# Patient Record
Sex: Female | Born: 1995 | Hispanic: No | Marital: Single | Smoking: Never smoker
Health system: Southern US, Community
[De-identification: ages and names within clinical notes are randomized; demographics above are authoritative.]

## PROBLEM LIST (undated history)

## (undated) DIAGNOSIS — Z789 Other specified health status: Secondary | ICD-10-CM

## (undated) DIAGNOSIS — Z3400 Encounter for supervision of normal first pregnancy, unspecified trimester: Secondary | ICD-10-CM

## (undated) HISTORY — PX: NO PAST SURGERIES: SHX2092

---
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
## 2018-06-29 ENCOUNTER — Ambulatory Visit (INDEPENDENT_AMBULATORY_CARE_PROVIDER_SITE_OTHER): Payer: Medicaid Other

## 2018-06-29 DIAGNOSIS — Z3201 Encounter for pregnancy test, result positive: Secondary | ICD-10-CM

## 2018-06-29 DIAGNOSIS — Z3200 Encounter for pregnancy test, result unknown: Secondary | ICD-10-CM

## 2018-06-29 LAB — POCT URINE PREGNANCY: PREG TEST UR: POSITIVE — AB

## 2018-06-29 NOTE — Progress Notes (Signed)
I have reviewed the chart and agree with nursing staff's documentation of this patient's encounter.  Catalina AntiguaPeggy Marisha Renier, MD 06/29/2018 11:28 AM

## 2018-06-29 NOTE — Progress Notes (Signed)
..   Ms. Pultz presents today for UPT. She has no unusual complaints. LMP: 05-16-18    OBJECTIVE: Appears well, in no apparent distress.  OB History   None    Home UPT Result: Positive     In-Office UPT result: Positive I have reviewed the patient's medical, obstetrical, social, and family histories, and medications.   ASSESSMENT: Positive pregnancy test  PLAN Prenatal care to be completed at: [facility]

## 2018-08-18 NOTE — L&D Delivery Note (Signed)
Delivery Note At 5:00 PM a viable and healthy female was delivered via Vaginal, Spontaneous (Presentation: LOA).  APGAR: 8, 9; weight  2353 g, 5 lb 3 oz.   Placenta status: spontaneous, intact.  Cord: 3 vessel with the following complications: none.    Anesthesia:  epidural Episiotomy: None Lacerations: 2nd degree;Perineal Suture Repair: 3.0 monocryl Est. Blood Loss (mL): 225  Mom to postpartum.  Baby to Couplet care / Skin to Skin.  Briana Carr is a 23 y.o. female G1P0 with IUP at [redacted]w[redacted]d admitted for IOL for preeclampsia.  She progressed with augmentation to complete and pushed less than 10 minutes to deliver.  Cord clamping delayed by 1-3 minutes then clamped by CNM and cut by pt mother.  Placenta intact and spontaneous, bleeding minimal.  Second degree perineal laceration repaired without difficulty.  Mom and baby stable prior to transfer to postpartum. She plans on breastfeeding. She is undecided about birth control.  Briana Carr 02/05/2019, 5:47 PM

## 2018-11-22 ENCOUNTER — Encounter: Payer: Medicaid Other | Admitting: Obstetrics

## 2018-11-24 ENCOUNTER — Ambulatory Visit (INDEPENDENT_AMBULATORY_CARE_PROVIDER_SITE_OTHER): Payer: Medicaid Other | Admitting: Obstetrics

## 2018-11-24 ENCOUNTER — Other Ambulatory Visit (HOSPITAL_COMMUNITY)
Admission: RE | Admit: 2018-11-24 | Discharge: 2018-11-24 | Disposition: A | Discharge status: Home / Self Care | Payer: Medicaid Other | Source: Ambulatory Visit | Attending: Obstetrics | Admitting: Obstetrics

## 2018-11-24 ENCOUNTER — Encounter: Payer: Self-pay | Admitting: Obstetrics

## 2018-11-24 ENCOUNTER — Other Ambulatory Visit: Payer: Self-pay

## 2018-11-24 VITALS — BP 117/80 | HR 114 | Ht 61.0 in | Wt 103.3 lb

## 2018-11-24 DIAGNOSIS — Z3402 Encounter for supervision of normal first pregnancy, second trimester: Secondary | ICD-10-CM

## 2018-11-24 DIAGNOSIS — Z3400 Encounter for supervision of normal first pregnancy, unspecified trimester: Secondary | ICD-10-CM | POA: Insufficient documentation

## 2018-11-24 DIAGNOSIS — Z3482 Encounter for supervision of other normal pregnancy, second trimester: Secondary | ICD-10-CM | POA: Diagnosis not present

## 2018-11-24 DIAGNOSIS — Z3A27 27 weeks gestation of pregnancy: Secondary | ICD-10-CM

## 2018-11-24 DIAGNOSIS — Z3481 Encounter for supervision of other normal pregnancy, first trimester: Secondary | ICD-10-CM | POA: Diagnosis not present

## 2018-11-24 HISTORY — DX: Encounter for supervision of normal first pregnancy, unspecified trimester: Z34.00

## 2018-11-24 LAB — OB RESULTS CONSOLE GBS: GBS: POSITIVE

## 2018-11-24 MED ORDER — VITAFOL GUMMIES 3.33-0.333-34.8 MG PO CHEW: 3.0000 | CHEWABLE_TABLET | Freq: Every day | ORAL | 11 refills | Status: DC

## 2018-11-24 NOTE — Progress Notes (Signed)
Subjective:    Briana Carr is being seen today for her first obstetrical visit.  This is not a planned pregnancy. She is at [redacted]w[redacted]d gestation. Her obstetrical history is significant for None. Relationship with FOB: significant other, not living together. Patient does intend to breast feed. Pregnancy history fully reviewed.  The information documented in the HPI was reviewed and verified.  Menstrual History: OB History    Gravida  1   Para      Term      Preterm      AB      Living        SAB      TAB      Ectopic      Multiple      Live Births               Patient's last menstrual period was 05/16/2018.    History reviewed. No pertinent past medical history.  History reviewed. No pertinent surgical history.  (Not in a hospital admission)  No Known Allergies  Social History   Tobacco Use  . Smoking status: Never Smoker  . Smokeless tobacco: Never Used  Substance Use Topics  . Alcohol use: Not Currently    Frequency: Never    History reviewed. No pertinent family history.   Review of Systems Constitutional: negative for weight loss Gastrointestinal: negative for vomiting Genitourinary:negative for genital lesions and vaginal discharge and dysuria Musculoskeletal:negative for back pain Behavioral/Psych: negative for abusive relationship, depression, illegal drug usage and tobacco use    Objective:    BP 117/80   Pulse (!) 114   Ht 5\' 1"  (1.549 m)   Wt 103 lb 4.8 oz (46.9 kg)   LMP 05/16/2018   BMI 19.52 kg/m  General Appearance:    Alert, cooperative, no distress, appears stated age  Head:    Normocephalic, without obvious abnormality, atraumatic  Eyes:    PERRL, conjunctiva/corneas clear, EOM's intact, fundi    benign, both eyes  Ears:    Normal TM's and external ear canals, both ears  Nose:   Nares normal, septum midline, mucosa normal, no drainage    or sinus tenderness  Throat:   Lips, mucosa, and tongue normal; teeth and gums normal   Neck:   Supple, symmetrical, trachea midline, no adenopathy;    thyroid:  no enlargement/tenderness/nodules; no carotid   bruit or JVD  Back:     Symmetric, no curvature, ROM normal, no CVA tenderness  Lungs:     Clear to auscultation bilaterally, respirations unlabored  Chest Wall:    No tenderness or deformity   Heart:    Regular rate and rhythm, S1 and S2 normal, no murmur, rub   or gallop  Breast Exam:    No tenderness, masses, or nipple abnormality  Abdomen:     Soft, non-tender, bowel sounds active all four quadrants,    no masses, no organomegaly  Genitalia:    Normal female without lesion, discharge or tenderness  Extremities:   Extremities normal, atraumatic, no cyanosis or edema  Pulses:   2+ and symmetric all extremities  Skin:   Skin color, texture, turgor normal, no rashes or lesions  Lymph nodes:   Cervical, supraclavicular, and axillary nodes normal  Neurologic:   CNII-XII intact, normal strength, sensation and reflexes    throughout      Lab Review Urine pregnancy test Labs reviewed yes Radiologic studies reviewed no  Assessment:    Pregnancy at [redacted]w[redacted]d weeks  Plan:      Prenatal vitamins.  Counseling provided regarding continued use of seat belts, cessation of alcohol consumption, smoking or use of illicit drugs; infection precautions i.e., influenza/TDAP immunizations, toxoplasmosis,CMV, parvovirus, listeria and varicella; workplace safety, exercise during pregnancy; routine dental care, safe medications, sexual activity, hot tubs, saunas, pools, travel, caffeine use, fish and methlymercury, potential toxins, hair treatments, varicose veins Weight gain recommendations per IOM guidelines reviewed: underweight/BMI< 18.5--> gain 28 - 40 lbs; normal weight/BMI 18.5 - 24.9--> gain 25 - 35 lbs; overweight/BMI 25 - 29.9--> gain 15 - 25 lbs; obese/BMI >30->gain  11 - 20 lbs Problem list reviewed and updated. FIRST/CF mutation testing/NIPT/QUAD SCREEN/fragile  X/Ashkenazi Jewish population testing/Spinal muscular atrophy discussed: requested. Role of ultrasound in pregnancy discussed; fetal survey: requested. Amniocentesis discussed: not indicated.  No orders of the defined types were placed in this encounter.  Orders Placed This Encounter  Procedures  . Culture, OB Urine  . US MFM OB COMP + 14 WK    Standing Status:   Future    Standing Expiration Date:   01/24/2020    Order Specific Question:   Reason for Exam (SYMPTOM  OR DIAGNOSIS REQUIRED)    Answer:   Anatomy    Order Specific Question:   Preferred Location    Answer:   [facility]  . Flu Vaccine QUAD 36+ mos IM (Fluarix, Quad PF)  . Obstetric Panel, Including HIV  . Genetic Screening    Follow up in 1 week for 2 hour GTT.  Televisit in 2 weeks. 50% of 20 min visit spent on counseling and coordination of care.    Brock BadHARLES A. Genice Kimberlin MD 11-24-2018

## 2018-11-24 NOTE — Patient Instructions (Signed)
Glucose Tolerance Test During Pregnancy Why am I having this test? The glucose tolerance test (GTT) is done to check how your body processes sugar (glucose). This is one of several tests used to diagnose diabetes that develops during pregnancy (gestational diabetes mellitus). Gestational diabetes is a temporary form of diabetes that some women develop during pregnancy. It usually occurs during the second trimester of pregnancy and goes away after delivery. Testing (screening) for gestational diabetes usually occurs between 24 and 28 weeks of pregnancy. You may have the GTT test after having a 1-hour glucose screening test if the results from that test indicate that you may have gestational diabetes. You may also have this test if:  You have a history of gestational diabetes.  You have a history of giving birth to very large babies or have experienced repeated fetal loss (stillbirth).  You have signs and symptoms of diabetes, such as: ? Changes in your vision. ? Tingling or numbness in your hands or feet. ? Changes in hunger, thirst, and urination that are not otherwise explained by your pregnancy. What is being tested? This test measures the amount of glucose in your blood at different times during a period of 3 hours. This indicates how well your body is able to process glucose. What kind of sample is taken?  Blood samples are required for this test. They are usually collected by inserting a needle into a blood vessel. How do I prepare for this test?  For 3 days before your test, eat normally. Have plenty of carbohydrate-rich foods.  Follow instructions from your health care provider about: ? Eating or drinking restrictions on the day of the test. You may be asked to not eat or drink anything other than water (fast) starting 8-10 hours before the test. ? Changing or stopping your regular medicines. Some medicines may interfere with this test. Tell a health care provider about:  All  medicines you are taking, including vitamins, herbs, eye drops, creams, and over-the-counter medicines.  Any blood disorders you have.  Any surgeries you have had.  Any medical conditions you have. What happens during the test? First, your blood glucose will be measured. This is referred to as your fasting blood glucose, since you fasted before the test. Then, you will drink a glucose solution that contains a certain amount of glucose. Your blood glucose will be measured again 1, 2, and 3 hours after drinking the solution. This test takes about 3 hours to complete. You will need to stay at the testing location during this time. During the testing period:  Do not eat or drink anything other than the glucose solution.  Do not exercise.  Do not use any products that contain nicotine or tobacco, such as cigarettes and e-cigarettes. If you need help stopping, ask your health care provider. The testing procedure may vary among health care providers and hospitals. How are the results reported? Your results will be reported as milligrams of glucose per deciliter of blood (mg/dL) or millimoles per liter (mmol/L). Your health care provider will compare your results to normal ranges that were established after testing a large group of people (reference ranges). Reference ranges may vary among labs and hospitals. For this test, common reference ranges are:  Fasting: less than 95-105 mg/dL (5.3-5.8 mmol/L).  1 hour after drinking glucose: less than 180-190 mg/dL (10.0-10.5 mmol/L).  2 hours after drinking glucose: less than 155-165 mg/dL (8.6-9.2 mmol/L).  3 hours after drinking glucose: 140-145 mg/dL (7.8-8.1 mmol/L). What do the   results mean? Results within reference ranges are considered normal, meaning that your glucose levels are well-controlled. If two or more of your blood glucose levels are high, you may be diagnosed with gestational diabetes. If only one level is high, your health care  provider may suggest repeat testing or other tests to confirm a diagnosis. Talk with your health care provider about what your results mean. Questions to ask your health care provider Ask your health care provider, or the department that is doing the test:  When will my results be ready?  How will I get my results?  What are my treatment options?  What other tests do I need?  What are my next steps? Summary  The glucose tolerance test (GTT) is one of several tests used to diagnose diabetes that develops during pregnancy (gestational diabetes mellitus). Gestational diabetes is a temporary form of diabetes that some women develop during pregnancy.  You may have the GTT test after having a 1-hour glucose screening test if the results from that test indicate that you may have gestational diabetes. You may also have this test if you have any symptoms or risk factors for gestational diabetes.  Talk with your health care provider about what your results mean. This information is not intended to replace advice given to you by your health care provider. Make sure you discuss any questions you have with your health care provider. Document Released: 02/03/2012 Document Revised: 03/16/2017 Document Reviewed: 03/16/2017 Elsevier Interactive Patient Education  2019 Elsevier Inc.  

## 2018-11-24 NOTE — Progress Notes (Signed)
Pt presents for New OB. Pt has not had any prenatal care. This was not a planned pregnancy. She does not live with FOB. Mother is her support person.

## 2018-11-25 LAB — OBSTETRIC PANEL, INCLUDING HIV
Antibody Screen: NEGATIVE
Basophils Absolute: 0 10*3/uL (ref 0.0–0.2)
Basos: 0 %
EOS (ABSOLUTE): 0.1 10*3/uL (ref 0.0–0.4)
Eos: 1 %
HIV Screen 4th Generation wRfx: NONREACTIVE
Hematocrit: 34 % (ref 34.0–46.6)
Hemoglobin: 12.3 g/dL (ref 11.1–15.9)
Hepatitis B Surface Ag: NEGATIVE
Immature Grans (Abs): 0.2 10*3/uL — ABNORMAL HIGH (ref 0.0–0.1)
Immature Granulocytes: 2 %
Lymphocytes Absolute: 1.7 10*3/uL (ref 0.7–3.1)
Lymphs: 15 %
MCH: 30.5 pg (ref 26.6–33.0)
MCHC: 36.2 g/dL — ABNORMAL HIGH (ref 31.5–35.7)
MCV: 84 fL (ref 79–97)
Monocytes Absolute: 0.8 10*3/uL (ref 0.1–0.9)
Monocytes: 6 %
Neutrophils Absolute: 9.2 10*3/uL — ABNORMAL HIGH (ref 1.4–7.0)
Neutrophils: 76 %
Platelets: 176 10*3/uL (ref 150–450)
RBC: 4.03 x10E6/uL (ref 3.77–5.28)
RDW: 13 % (ref 11.7–15.4)
RPR Ser Ql: NONREACTIVE
Rh Factor: POSITIVE
Rubella Antibodies, IGG: 1.34 index (ref >0.99)
WBC: 12 10*3/uL — ABNORMAL HIGH (ref 3.4–10.8)

## 2018-11-25 LAB — CERVICOVAGINAL ANCILLARY ONLY
Bacterial vaginitis: POSITIVE — AB
Candida vaginitis: POSITIVE — AB
Chlamydia: NEGATIVE
Neisseria Gonorrhea: NEGATIVE
Trichomonas: NEGATIVE

## 2018-11-26 ENCOUNTER — Other Ambulatory Visit: Payer: Self-pay | Admitting: Obstetrics

## 2018-11-26 DIAGNOSIS — N760 Acute vaginitis: Secondary | ICD-10-CM

## 2018-11-26 DIAGNOSIS — B9689 Other specified bacterial agents as the cause of diseases classified elsewhere: Secondary | ICD-10-CM

## 2018-11-26 DIAGNOSIS — B3731 Acute candidiasis of vulva and vagina: Secondary | ICD-10-CM

## 2018-11-26 DIAGNOSIS — B373 Candidiasis of vulva and vagina: Secondary | ICD-10-CM

## 2018-11-26 MED ORDER — TINIDAZOLE 500 MG PO TABS: 1000.0000 mg | ORAL_TABLET | Freq: Every day | ORAL | 2 refills | Status: DC

## 2018-11-26 MED ORDER — TERCONAZOLE 0.4 % VA CREA: 1.0000 | TOPICAL_CREAM | Freq: Every day | VAGINAL | 0 refills | Status: DC

## 2018-11-27 LAB — URINE CULTURE, OB REFLEX

## 2018-11-27 LAB — CULTURE, OB URINE

## 2018-11-28 ENCOUNTER — Other Ambulatory Visit: Payer: Self-pay | Admitting: Obstetrics

## 2018-11-28 DIAGNOSIS — O2340 Unspecified infection of urinary tract in pregnancy, unspecified trimester: Principal | ICD-10-CM

## 2018-11-28 DIAGNOSIS — B955 Unspecified streptococcus as the cause of diseases classified elsewhere: Secondary | ICD-10-CM

## 2018-11-28 MED ORDER — AMOXICILLIN-POT CLAVULANATE 875-125 MG PO TABS: 1.0000 | ORAL_TABLET | Freq: Two times a day (BID) | ORAL | 0 refills | Status: DC

## 2018-11-30 ENCOUNTER — Telehealth: Payer: Self-pay

## 2018-11-30 ENCOUNTER — Encounter: Payer: Self-pay | Admitting: Obstetrics

## 2018-11-30 LAB — CYTOLOGY - PAP
Diagnosis: UNDETERMINED — AB
HPV: NOT DETECTED

## 2018-11-30 NOTE — Telephone Encounter (Signed)
Patient notified that PA is being done for Rx sent by Dr. Clearance Coots and Pharmacy will call her when they are ready.

## 2018-11-30 NOTE — Telephone Encounter (Signed)
-----   Message from Briana Bad, MD sent at 11/26/2018  6:48 AM EDT ----- Lucienne Capers Rx for BV Terazol Rx for yeast

## 2018-12-01 ENCOUNTER — Encounter: Payer: Medicaid Other | Admitting: Obstetrics and Gynecology

## 2018-12-01 ENCOUNTER — Telehealth: Payer: Self-pay

## 2018-12-01 ENCOUNTER — Other Ambulatory Visit: Payer: Medicaid Other

## 2018-12-01 ENCOUNTER — Encounter: Payer: Self-pay | Admitting: Obstetrics

## 2018-12-01 NOTE — Telephone Encounter (Signed)
Patient notified to pick up Rx and repeat PAP in one year. She verbalized understanding.   Notes recorded by Brock Bad, MD on 12/01/2018 at 10:06 AM EDT ASCUS pap smear. Repeat pap in 1 year. ------  Notes recorded by Brock Bad, MD on 11/28/2018 at 7:10 AM EDT Augmentin Rx for GBS UTI ------  Notes recorded by Brock Bad, MD on 11/26/2018 at 6:48 AM EDT Tindamax Rx for BV Terazol Rx for yeast

## 2018-12-01 NOTE — Telephone Encounter (Deleted)
-----   Message from Charles A Harper, MD sent at 12/01/2018 10:06 AM EDT ----- ASCUS pap smear.  Repeat pap in 1 year. 

## 2018-12-01 NOTE — Telephone Encounter (Signed)
-----   Message from Brock Bad, MD sent at 12/01/2018 10:06 AM EDT ----- ASCUS pap smear.  Repeat pap in 1 year.

## 2018-12-01 NOTE — Progress Notes (Signed)
Patient notified

## 2018-12-03 ENCOUNTER — Other Ambulatory Visit: Payer: Medicaid Other

## 2018-12-03 ENCOUNTER — Ambulatory Visit (INDEPENDENT_AMBULATORY_CARE_PROVIDER_SITE_OTHER): Payer: Medicaid Other | Admitting: Obstetrics & Gynecology

## 2018-12-03 ENCOUNTER — Other Ambulatory Visit: Payer: Self-pay

## 2018-12-03 VITALS — BP 115/81 | HR 97 | Wt 103.4 lb

## 2018-12-03 DIAGNOSIS — B955 Unspecified streptococcus as the cause of diseases classified elsewhere: Secondary | ICD-10-CM

## 2018-12-03 DIAGNOSIS — O0930 Supervision of pregnancy with insufficient antenatal care, unspecified trimester: Secondary | ICD-10-CM | POA: Insufficient documentation

## 2018-12-03 DIAGNOSIS — Z3403 Encounter for supervision of normal first pregnancy, third trimester: Secondary | ICD-10-CM | POA: Diagnosis not present

## 2018-12-03 DIAGNOSIS — B951 Streptococcus, group B, as the cause of diseases classified elsewhere: Secondary | ICD-10-CM | POA: Insufficient documentation

## 2018-12-03 DIAGNOSIS — O2340 Unspecified infection of urinary tract in pregnancy, unspecified trimester: Secondary | ICD-10-CM

## 2018-12-03 DIAGNOSIS — Z3A28 28 weeks gestation of pregnancy: Secondary | ICD-10-CM

## 2018-12-03 DIAGNOSIS — O98813 Other maternal infectious and parasitic diseases complicating pregnancy, third trimester: Secondary | ICD-10-CM

## 2018-12-03 DIAGNOSIS — O2343 Unspecified infection of urinary tract in pregnancy, third trimester: Secondary | ICD-10-CM

## 2018-12-03 NOTE — Addendum Note (Signed)
Addended by: Allie Bossier on: 12/03/2018 09:36 AM   Modules accepted: Orders

## 2018-12-03 NOTE — Progress Notes (Signed)
Declined Tdap and Flu

## 2018-12-03 NOTE — Progress Notes (Signed)
   PRENATAL VISIT NOTE  Subjective:  Briana Carr is a 23 y.o. G1P0 at [redacted]w[redacted]d being seen today for ongoing prenatal care.  She is currently monitored for the following issues for this low-risk pregnancy and has Supervision of normal first pregnancy on their problem list.  Patient reports no complaints.   .  .   . Denies leaking of fluid.   The following portions of the patient's history were reviewed and updated as appropriate: allergies, current medications, past family history, past medical history, past social history, past surgical history and problem list.   Objective:  There were no vitals filed for this visit.  Fetal Status:           General:  Alert, oriented and cooperative. Patient is in no acute distress.  Skin: Skin is warm and dry. No rash noted.   Cardiovascular: Normal heart rate noted  Respiratory: Normal respiratory effort, no problems with respiration noted  Abdomen: Soft, gravid, appropriate for gestational age.        Pelvic: Cervical exam deferred        Extremities: Normal range of motion.     Mental Status: Normal mood and affect. Normal behavior. Normal judgment and thought content.   Assessment and Plan:  Pregnancy: G1P0 at [redacted]w[redacted]d 1. Encounter for supervision of normal first pregnancy in third trimester - 28 week labs today - TDAP and flu vaccines declined, we discussed ACOG recs - Baby scripts OPX ordered  2. GBS UTI- augmentin prescribed - treat in labor also  Preterm labor symptoms and general obstetric precautions including but not limited to vaginal bleeding, contractions, leaking of fluid and fetal movement were reviewed in detail with the patient. Please refer to After Visit Summary for other counseling recommendations.   No follow-ups on file.  Future Appointments  Date Time Provider Department Center  12/10/2018  1:15 PM [facility] [place] 4 [facility]    Allie Bossier, MD

## 2018-12-04 LAB — CBC
Hematocrit: 35.4 % (ref 34.0–46.6)
Hemoglobin: 12.3 g/dL (ref 11.1–15.9)
MCH: 29.2 pg (ref 26.6–33.0)
MCHC: 34.7 g/dL (ref 31.5–35.7)
MCV: 84 fL (ref 79–97)
Platelets: 193 10*3/uL (ref 150–450)
RBC: 4.21 x10E6/uL (ref 3.77–5.28)
RDW: 13.1 % (ref 11.7–15.4)
WBC: 12.6 10*3/uL — ABNORMAL HIGH (ref 3.4–10.8)

## 2018-12-04 LAB — GLUCOSE TOLERANCE, 2 HOURS W/ 1HR
Glucose, 1 hour: 97 mg/dL (ref 65–179)
Glucose, 2 hour: 100 mg/dL (ref 65–152)
Glucose, Fasting: 69 mg/dL (ref 65–91)

## 2018-12-04 LAB — HIV ANTIBODY (ROUTINE TESTING W REFLEX): HIV Screen 4th Generation wRfx: NONREACTIVE

## 2018-12-04 LAB — RPR: RPR Ser Ql: NONREACTIVE

## 2018-12-10 ENCOUNTER — Ambulatory Visit (HOSPITAL_COMMUNITY)
Admission: RE | Admit: 2018-12-10 | Discharge: 2018-12-10 | Disposition: A | Discharge status: Home / Self Care | Payer: Medicaid Other | Source: Ambulatory Visit | Attending: Obstetrics and Gynecology | Admitting: Obstetrics and Gynecology

## 2018-12-10 ENCOUNTER — Other Ambulatory Visit: Payer: Self-pay | Admitting: Obstetrics

## 2018-12-10 ENCOUNTER — Other Ambulatory Visit: Payer: Self-pay

## 2018-12-10 DIAGNOSIS — Z3A29 29 weeks gestation of pregnancy: Secondary | ICD-10-CM

## 2018-12-10 DIAGNOSIS — Z3400 Encounter for supervision of normal first pregnancy, unspecified trimester: Secondary | ICD-10-CM | POA: Insufficient documentation

## 2018-12-10 DIAGNOSIS — Z363 Encounter for antenatal screening for malformations: Secondary | ICD-10-CM

## 2018-12-10 DIAGNOSIS — O0933 Supervision of pregnancy with insufficient antenatal care, third trimester: Secondary | ICD-10-CM

## 2018-12-13 ENCOUNTER — Other Ambulatory Visit (HOSPITAL_COMMUNITY): Payer: Self-pay | Admitting: *Deleted

## 2018-12-13 DIAGNOSIS — Z362 Encounter for other antenatal screening follow-up: Secondary | ICD-10-CM

## 2018-12-24 ENCOUNTER — Encounter: Payer: Self-pay | Admitting: Obstetrics and Gynecology

## 2018-12-24 ENCOUNTER — Other Ambulatory Visit: Payer: Self-pay

## 2018-12-24 ENCOUNTER — Ambulatory Visit (INDEPENDENT_AMBULATORY_CARE_PROVIDER_SITE_OTHER): Payer: Medicaid Other | Admitting: Obstetrics and Gynecology

## 2018-12-24 DIAGNOSIS — Z3403 Encounter for supervision of normal first pregnancy, third trimester: Secondary | ICD-10-CM

## 2018-12-24 DIAGNOSIS — B951 Streptococcus, group B, as the cause of diseases classified elsewhere: Secondary | ICD-10-CM

## 2018-12-24 DIAGNOSIS — Z3A31 31 weeks gestation of pregnancy: Secondary | ICD-10-CM

## 2018-12-24 DIAGNOSIS — O0930 Supervision of pregnancy with insufficient antenatal care, unspecified trimester: Secondary | ICD-10-CM

## 2018-12-24 DIAGNOSIS — O0933 Supervision of pregnancy with insufficient antenatal care, third trimester: Secondary | ICD-10-CM

## 2018-12-24 DIAGNOSIS — O2343 Unspecified infection of urinary tract in pregnancy, third trimester: Secondary | ICD-10-CM

## 2018-12-24 NOTE — Progress Notes (Signed)
ROB w/no complaints.  Still awaiting cuff to come in mail.

## 2018-12-24 NOTE — Progress Notes (Signed)
   TELEHEALTH VIRTUAL OBSTETRICS PRENATAL VISIT ENCOUNTER NOTE  I connected with Briana Carr on 12/24/18 at  8:15 AM EDT by WebEx at home and verified that I am speaking with the correct person using two identifiers.   I discussed the limitations, risks, security and privacy concerns of performing an evaluation and management service by telephone and the availability of in person appointments. I also discussed with the patient that there may be a patient responsible charge related to this service. The patient expressed understanding and agreed to proceed. Subjective:  Briana Carr is a 23 y.o. G1P0 at [redacted]w[redacted]d being seen today for ongoing prenatal care.  She is currently monitored for the following issues for this low-risk pregnancy and has Supervision of normal first pregnancy; GBS (group B streptococcus) UTI complicating pregnancy; and Late prenatal care on their problem list.  Patient reports no complaints.  Reports fetal movement. Contractions: Not present. Vag. Bleeding: None.  Movement: Present. Denies any contractions, bleeding or leaking of fluid.   The following portions of the patient's history were reviewed and updated as appropriate: allergies, current medications, past family history, past medical history, past social history, past surgical history and problem list.   Objective:  There were no vitals filed for this visit.  Fetal Status:     Movement: Present     General:  Alert, oriented and cooperative. Patient is in no acute distress.  Respiratory: Normal respiratory effort, no problems with respiration noted  Mental Status: Normal mood and affect. Normal behavior. Normal judgment and thought content.  Rest of physical exam deferred due to type of encounter  Assessment and Plan:  Pregnancy: G1P0 at [redacted]w[redacted]d  1. Encounter for supervision of normal first pregnancy in third trimester Declined contraception Reviewed breastfeeding No issues BP cuff has not arrived, to call if it does  not arrive in next week or so  2. Group B Streptococcus urinary tract infection affecting pregnancy in third trimester ppx in labor  3. Late prenatal care   Preterm labor symptoms and general obstetric precautions including but not limited to vaginal bleeding, contractions, leaking of fluid and fetal movement were reviewed in detail with the patient. I discussed the assessment and treatment plan with the patient. The patient was provided an opportunity to ask questions and all were answered. The patient agreed with the plan and demonstrated an understanding of the instructions. The patient was advised to call back or seek an in-person office evaluation/go to [facility] at [facility] for any urgent or concerning symptoms. Please refer to After Visit Summary for other counseling recommendations.   I provided 14 minutes of face-to-face via WebEx time during this encounter.  Return in about 2 weeks (around 01/07/2019) for OB visit, virtual.  Future Appointments  Date Time Provider Department Center  01/07/2019  8:15 AM [facility] [place] 4 [facility]    Conan Bowens, MD [facility], [facility]

## 2019-01-07 ENCOUNTER — Other Ambulatory Visit: Payer: Self-pay

## 2019-01-07 ENCOUNTER — Encounter: Payer: Self-pay | Admitting: Obstetrics

## 2019-01-07 ENCOUNTER — Ambulatory Visit (INDEPENDENT_AMBULATORY_CARE_PROVIDER_SITE_OTHER): Payer: Medicaid Other | Admitting: Obstetrics

## 2019-01-07 ENCOUNTER — Ambulatory Visit (HOSPITAL_COMMUNITY)
Admission: RE | Admit: 2019-01-07 | Discharge: 2019-01-07 | Disposition: A | Discharge status: Home / Self Care | Payer: Medicaid Other | Source: Ambulatory Visit | Attending: Obstetrics and Gynecology | Admitting: Obstetrics and Gynecology

## 2019-01-07 DIAGNOSIS — Z3A33 33 weeks gestation of pregnancy: Secondary | ICD-10-CM | POA: Diagnosis not present

## 2019-01-07 DIAGNOSIS — Z362 Encounter for other antenatal screening follow-up: Secondary | ICD-10-CM | POA: Insufficient documentation

## 2019-01-07 DIAGNOSIS — Z3403 Encounter for supervision of normal first pregnancy, third trimester: Secondary | ICD-10-CM

## 2019-01-07 DIAGNOSIS — O0933 Supervision of pregnancy with insufficient antenatal care, third trimester: Secondary | ICD-10-CM

## 2019-01-07 DIAGNOSIS — O2343 Unspecified infection of urinary tract in pregnancy, third trimester: Secondary | ICD-10-CM

## 2019-01-07 DIAGNOSIS — B955 Unspecified streptococcus as the cause of diseases classified elsewhere: Secondary | ICD-10-CM

## 2019-01-07 DIAGNOSIS — O0930 Supervision of pregnancy with insufficient antenatal care, unspecified trimester: Secondary | ICD-10-CM

## 2019-01-07 DIAGNOSIS — O2340 Unspecified infection of urinary tract in pregnancy, unspecified trimester: Secondary | ICD-10-CM

## 2019-01-07 NOTE — Progress Notes (Signed)
TELEHEALTH OBSTETRICS PRENATAL VIRTUAL VIDEO VISIT ENCOUNTER NOTE  Provider location: [facility]   I connected with [place] Zinni on 01/07/19 at  9:45 AM EDT by WebEx OB MyChart Video Encounter at home and verified that I am speaking with the correct person using two identifiers.   I discussed the limitations, risks, security and privacy concerns of performing an evaluation and management service by telephone and the availability of in person appointments. I also discussed with the patient that there may be a patient responsible charge related to this service. The patient expressed understanding and agreed to proceed. Subjective:  Briana Carr is a 23 y.o. G1P0 at [redacted]w[redacted]d being seen today for ongoing prenatal care.  She is currently monitored for the following issues for this low-risk pregnancy and has Supervision of normal first pregnancy; GBS (group B streptococcus) UTI complicating pregnancy; and Late prenatal care on their problem list.  Patient reports no complaints.  Contractions: Not present. Vag. Bleeding: None.  Movement: Present. Denies any leaking of fluid.   The following portions of the patient's history were reviewed and updated as appropriate: allergies, current medications, past family history, past medical history, past social history, past surgical history and problem list.   Objective:  There were no vitals filed for this visit.  Fetal Status:     Movement: Present     General:  Alert, oriented and cooperative. Patient is in no acute distress.  Respiratory: Normal respiratory effort, no problems with respiration noted  Mental Status: Normal mood and affect. Normal behavior. Normal judgment and thought content.  Rest of physical exam deferred due to type of encounter  Imaging: [place] Mfm Ob Detail +14 Wk  Result Date: 12/10/2018 ----------------------------------------------------------------------  OBSTETRICS REPORT                       (Signed  Final 12/10/2018 03:00 pm) ---------------------------------------------------------------------- Patient Info  ID #:       [id number]                          D.O.B.:  [birth year] (22 yrs)  Name:       Raoul Pitch Koepp                     Visit Date: 12/10/2018 01:28 pm ---------------------------------------------------------------------- Performed By  Performed By:     Emeline Darling BS,      Ref. Address:      [address], [address]                                                              [place], [place]  [id number]  Attending:        Noralee Spaceavi Shankar MD        [facility]  Referred By:      Brock BadHARLES A                    Negin Hegg MD ---------------------------------------------------------------------- Orders   #  Description                          Code         Ordered By   1  US MFM OB DETAIL +14 WK              76811.01     Coral CeoHARLES Cortavius Montesinos  ----------------------------------------------------------------------   #  Order #                    Accession #                 Episode #   1  [id number]                  [id number]                  [id number]  ---------------------------------------------------------------------- Indications   Late to prenatal care, third trimester (Low    O09.33   Risk NIPS)   [redacted] weeks gestation of pregnancy                Z3A.29   Encounter for antenatal screening for          Z36.3   malformations  ---------------------------------------------------------------------- Vital Signs  Weight (lb): 103                               Height:        5'1"  BMI:         19.46 ---------------------------------------------------------------------- Fetal Evaluation  Num Of Fetuses:          1  Fetal Heart Rate(bpm):   157  Cardiac Activity:        Observed  Presentation:             Cephalic  Placenta:                Anterior  P. Cord Insertion:       Visualized  Amniotic Fluid  AFI FV:      Within normal limits  AFI Sum(cm)     %Tile       Largest Pocket(cm)  20.88           83          7.99  RUQ(cm)       RLQ(cm)       LUQ(cm)        LLQ(cm)  4.35          2.35          6.19           7.99 ---------------------------------------------------------------------- Biometry  BPD:      73.8  mm     G. Age:  29w 4d         34  %    CI:        72.81   %    70 - 86                                                          FL/HC:       20.3  %    19.2 - 21.4  HC:       275   mm     G. Age:  30w 0d         25  %    HC/AC:       1.05       0.99 - 1.21  AC:      262.5  mm     G. Age:  30w 2d         64  %    FL/BPD:      75.6  %    71 - 87  FL:       55.8  mm     G. Age:  29w 3d         26  %    FL/AC:       21.3  %    20 - 24  Est. FW:    1487   gm     3 lb 4 oz     57  % ---------------------------------------------------------------------- OB History  Gravidity:    1 ---------------------------------------------------------------------- Gestational Age  LMP:           29w 5d        Date:  05/16/18                 EDD:   02/20/19  U/S Today:     29w 6d                                        EDD:   02/19/19  Best:          29w 5d     Det. By:  LMP  (05/16/18)          EDD:   02/20/19 ---------------------------------------------------------------------- Anatomy  Cranium:               Appears normal         Aortic Arch:            Not well visualized  Cavum:                 Appears normal         Ductal Arch:            Not well visualized  Ventricles:            Appears normal         Diaphragm:              Appears normal  Choroid Plexus:        Appears normal         Stomach:  Appears normal, left                                                                        sided  Cerebellum:            Appears normal         Abdomen:                Appears normal  Posterior Fossa:        Appears normal         Abdominal Wall:         Not well visualized  Nuchal Fold:           Not applicable (>20    Cord Vessels:           Appears normal ([redacted]                         wks GA)                                        vessel cord)  Face:                  Orbits nl; profile not Kidneys:                Appear normal                         well visualized  Lips:                  Appears normal         Bladder:                Appears normal  Thoracic:              Appears normal         Spine:                  Limited views                                                                        appear normal  Heart:                 Appears normal         Upper Extremities:      Appears normal                         (4CH, axis, and                         situs)  RVOT:                  Appears normal  Lower Extremities:      Appears normal  LVOT:                  Appears normal  Other:  Parents do not wish to know sex of fetus. Technically difficult due to          advanced GA and fetal position. ---------------------------------------------------------------------- Cervix Uterus Adnexa  Cervix  Not visualized (advanced GA >24wks) ---------------------------------------------------------------------- Impression  Late prenatal care.  On ultrasound, the fetal growth is appropriate for the  gestational age. Amniotic fluid is normal and good fetal  activity is seen. Fetal anatomy appears normal, but limited  because of advanced gestational age. ---------------------------------------------------------------------- Recommendations  An appointment was made for her to return in 4 weeks for  fetal growth assessment. ----------------------------------------------------------------------                  Noralee Space, MD Electronically Signed Final Report   12/10/2018 03:00 pm ----------------------------------------------------------------------  [place] Mfm Ob Follow Up  Result Date:  01/07/2019 ----------------------------------------------------------------------  OBSTETRICS REPORT                       (Signed Final 01/07/2019 09:21 am) ---------------------------------------------------------------------- Patient Info  ID #:       [id number]                          D.O.B.:  1996/06/13 (22 yrs)  Name:       Raoul Pitch Lora                     Visit Date: 01/07/2019 09:12 am ---------------------------------------------------------------------- Performed By  Performed By:     Emeline Darling BS,      Ref. Address:     [address]                                                             [place], [place]                                                             [address]  Attending:        Noralee Space MD        [facility]  Referred By:      Brock Bad MD ---------------------------------------------------------------------- Orders   #  Description                          Code         Ordered By   1  [place] MFM OB FOLLOW UP                  [phone]     RAVI SHANKAR  ----------------------------------------------------------------------   #  Order #                    Accession #                 Episode #   1  [id number]                  [id number]                  [id number]  ---------------------------------------------------------------------- Indications   [redacted] weeks gestation of pregnancy                Z3A.33   Late to prenatal care, third trimester (Low    O09.33   Risk NIPS)   Antenatal follow-up for nonvisualized fetal    Z36.2   anatomy  ---------------------------------------------------------------------- Vital Signs  Weight (lb): 103                               Height:        5'1"  BMI:         19.46 ---------------------------------------------------------------------- Fetal  Evaluation  Num Of Fetuses:         1  Fetal Heart Rate(bpm):  127  Cardiac Activity:       Observed  Presentation:           Cephalic  Placenta:               Anterior  P. Cord Insertion:      Previously Visualized  Amniotic Fluid  AFI FV:      Within normal limits  AFI Sum(cm)     %Tile       Largest Pocket(cm)  10.11           19          3.91  RUQ(cm)       RLQ(cm)       LUQ(cm)        LLQ(cm)  1.1           2.67          3.91           2.43 ---------------------------------------------------------------------- Biometry  BPD:      81.6  mm     G. Age:  32w 6d         21  %    CI:        76.35   %    70 - 86  FL/HC:      20.6   %    19.4 - 21.8  HC:      295.9  mm     G. Age:  32w 5d          5  %    HC/AC:      1.04        0.96 - 1.11  AC:      285.7  mm     G. Age:  32w 4d         22  %    FL/BPD:     74.9   %    71 - 87  FL:       61.1  mm     G. Age:  31w 5d          5  %    FL/AC:      21.4   %    20 - 24  Est. FW:    1958  gm      4 lb 5 oz     33  % ---------------------------------------------------------------------- OB History  Gravidity:    1 ---------------------------------------------------------------------- Gestational Age  LMP:           33w 5d        Date:  05/16/18                 EDD:   02/20/19  U/S Today:     32w 3d                                        EDD:   03/01/19  Best:          33w 5d     Det. By:  LMP  (05/16/18)          EDD:   02/20/19 ---------------------------------------------------------------------- Anatomy  Cranium:               Appears normal         Aortic Arch:            Appears normal  Cavum:                 Previously seen        Ductal Arch:            Appears normal  Ventricles:            Previously seen        Diaphragm:              Appears normal  Choroid Plexus:        Previously seen        Stomach:                Appears normal, left                                                                         sided  Cerebellum:            Previously seen        Abdomen:  Appears normal  Posterior Fossa:       Previously seen        Abdominal Wall:         Not well visualized  Nuchal Fold:           Not applicable (>20    Cord Vessels:           Previously seen                         wks GA)  Face:                  Appears normal         Kidneys:                Appear normal                         (orbits and profile)  Lips:                  Appears normal         Bladder:                Appears normal  Thoracic:              Appears normal         Spine:                  Limited views                                                                        appear normal  Heart:                 Appears normal         Upper Extremities:      Appears normal                         (4CH, axis, and                         situs)  RVOT:                  Appears normal         Lower Extremities:      Appears normal  LVOT:                  Appears normal  Other:  Parents do not wish to know sex of fetus. Technically difficult due to          advanced GA and fetal position. ---------------------------------------------------------------------- Cervix Uterus Adnexa  Cervix  Not visualized (advanced GA >24wks) ---------------------------------------------------------------------- Impression  Fetal growth is appropriate for gestational age. Amniotic fluid  is normal and good fetal activity is seen. Good interval growth  is seen. ---------------------------------------------------------------------- Recommendations  Follow-up scans as clinically indicated. ----------------------------------------------------------------------                  Noralee Space, MD Electronically Signed Final Report   01/07/2019 09:21 am ----------------------------------------------------------------------   Assessment and Plan:  Pregnancy: G1P0 at [redacted]w[redacted]d 1. Encounter for  supervision of normal first pregnancy in third trimester  2.  Late prenatal care  3. Beta hemolytic Streptococcus UTI complicating pregnancy, antepartum   Preterm labor symptoms and general obstetric precautions including but not limited to vaginal bleeding, contractions, leaking of fluid and fetal movement were reviewed in detail with the patient. I discussed the assessment and treatment plan with the patient. The patient was provided an opportunity to ask questions and all were answered. The patient agreed with the plan and demonstrated an understanding of the instructions. The patient was advised to call back or seek an in-person office evaluation/go to MAU at [facility] for any urgent or concerning symptoms. Please refer to After Visit Summary for other counseling recommendations.   I provided 10 minutes of face-to-face time during this encounter.  Return in about 2 weeks (around 01/21/2019) for [facility].   Coral Ceo, MD [facility], [facility] 01-07-2019

## 2019-01-07 NOTE — Progress Notes (Signed)
Pt is on the phone preparing for webex visit with provider. [redacted]w[redacted]d. Pt states she did not receive babyscripts activation email and did not get BP cuff. Pt denies HA's, blurry vision or abdominal pain. I advised pt we will send BP cuff to pharmacy.

## 2019-01-21 ENCOUNTER — Ambulatory Visit (INDEPENDENT_AMBULATORY_CARE_PROVIDER_SITE_OTHER): Payer: Medicaid Other | Admitting: Obstetrics & Gynecology

## 2019-01-21 VITALS — Wt 108.4 lb

## 2019-01-21 DIAGNOSIS — Z3403 Encounter for supervision of normal first pregnancy, third trimester: Secondary | ICD-10-CM | POA: Diagnosis not present

## 2019-01-21 DIAGNOSIS — Z136 Encounter for screening for cardiovascular disorders: Secondary | ICD-10-CM | POA: Diagnosis not present

## 2019-01-21 DIAGNOSIS — Z3A35 35 weeks gestation of pregnancy: Secondary | ICD-10-CM

## 2019-01-21 MED ORDER — BLOOD PRESSURE MONITOR KIT: 1.0000 | PACK | Freq: Once | 0 refills | Status: AC

## 2019-01-21 NOTE — Progress Notes (Signed)
   TELEHEALTH VIRTUAL OBSTETRICS VISIT ENCOUNTER NOTE  I connected with Briana Carr on 01/21/19 at  8:30 AM EDT by telephone at home and verified that I am speaking with the correct person using two identifiers.   I discussed the limitations, risks, security and privacy concerns of performing an evaluation and management service by telephone and the availability of in person appointments. I also discussed with the patient that there may be a patient responsible charge related to this service. The patient expressed understanding and agreed to proceed.  Subjective:  Briana Carr is a 23 y.o. G1P0 at 67w5dbeing followed for ongoing prenatal care.  She is currently monitored for the following issues for this low-risk pregnancy and has Supervision of normal first pregnancy; GBS (group B streptococcus) UTI complicating pregnancy; and Late prenatal care on their problem list.  Patient reports no complaints. Reports fetal movement. Denies any contractions, bleeding or leaking of fluid.   The following portions of the patient's history were reviewed and updated as appropriate: allergies, current medications, past family history, past medical history, past social history, past surgical history and problem list.   Objective:   General:  Alert, oriented and cooperative.   Mental Status: Normal mood and affect perceived. Normal judgment and thought content.  Rest of physical exam deferred due to type of encounter  Assessment and Plan:  Pregnancy: G1P0 at 346w5d. Encounter for supervision of normal first pregnancy in third trimester Not checking BP at home - Blood Pressure Monitor KIT; 1 kit by Does not apply route once for 1 dose.  Dispense: 1 each; Refill: 0  2. Screening for hypertension Live visit next week - Blood Pressure Monitor KIT; 1 kit by Does not apply route once for 1 dose.  Dispense: 1 each; Refill: 0  Preterm labor symptoms and general obstetric precautions including but not limited  to vaginal bleeding, contractions, leaking of fluid and fetal movement were reviewed in detail with the patient.  I discussed the assessment and treatment plan with the patient. The patient was provided an opportunity to ask questions and all were answered. The patient agreed with the plan and demonstrated an understanding of the instructions. The patient was advised to call back or seek an in-person office evaluation/[facility] or concerning [facility]. Please refer to After Visit Summary for other counseling recommendations.   I provided 10 minutes of non-face-to-face time during this encounter.  Return in about 1 week (around 01/28/2019) for in person.  No future appointments.  JaEmeterio ReeveMD [facility] [place]

## 2019-01-21 NOTE — Patient Instructions (Signed)

## 2019-01-21 NOTE — Progress Notes (Signed)
Pt does not have BP cuff at home, will order for pt today. 

## 2019-01-28 ENCOUNTER — Other Ambulatory Visit: Payer: Self-pay

## 2019-01-28 ENCOUNTER — Other Ambulatory Visit (HOSPITAL_COMMUNITY)
Admission: RE | Admit: 2019-01-28 | Discharge: 2019-01-28 | Disposition: A | Discharge status: Home / Self Care | Payer: Medicaid Other | Source: Ambulatory Visit | Attending: Obstetrics & Gynecology | Admitting: Obstetrics & Gynecology

## 2019-01-28 ENCOUNTER — Ambulatory Visit (INDEPENDENT_AMBULATORY_CARE_PROVIDER_SITE_OTHER): Payer: Medicaid Other | Admitting: Obstetrics & Gynecology

## 2019-01-28 VITALS — BP 113/80 | HR 91 | Wt 115.9 lb

## 2019-01-28 DIAGNOSIS — Z3403 Encounter for supervision of normal first pregnancy, third trimester: Secondary | ICD-10-CM | POA: Diagnosis not present

## 2019-01-28 DIAGNOSIS — O2343 Unspecified infection of urinary tract in pregnancy, third trimester: Secondary | ICD-10-CM

## 2019-01-28 DIAGNOSIS — Z3A36 36 weeks gestation of pregnancy: Secondary | ICD-10-CM

## 2019-01-28 DIAGNOSIS — B951 Streptococcus, group B, as the cause of diseases classified elsewhere: Secondary | ICD-10-CM

## 2019-01-28 NOTE — Patient Instructions (Signed)

## 2019-01-28 NOTE — Progress Notes (Signed)
ROB.  GBS+ Urine.

## 2019-01-28 NOTE — Progress Notes (Signed)
   PRENATAL VISIT NOTE  Subjective:  Briana Carr is a 23 y.o. G1P0 at [redacted]w[redacted]d being seen today for ongoing prenatal care.  She is currently monitored for the following issues for this low-risk pregnancy and has Supervision of normal first pregnancy; GBS (group B streptococcus) UTI complicating pregnancy; and Late prenatal care on their problem list.  Patient reports no complaints.  Contractions: Not present. Vag. Bleeding: None.  Movement: Present. Denies leaking of fluid.   The following portions of the patient's history were reviewed and updated as appropriate: allergies, current medications, past family history, past medical history, past social history, past surgical history and problem list.   Objective:   Vitals:   01/28/19 1038  BP: 113/80  Pulse: 91  Weight: 115 lb 14.4 oz (52.6 kg)    Fetal Status: Fetal Heart Rate (bpm): 156 Fundal Height: 32 cm Movement: Present  Presentation: Vertex  General:  Alert, oriented and cooperative. Patient is in no acute distress.  Skin: Skin is warm and dry. No rash noted.   Cardiovascular: Normal heart rate noted  Respiratory: Normal respiratory effort, no problems with respiration noted  Abdomen: Soft, gravid, appropriate for gestational age.  Pain/Pressure: Present     Pelvic: Cervical exam performed Dilation: Closed Effacement (%): 20 Station: -3  Extremities: Normal range of motion.  Edema: None  Mental Status: Normal mood and affect. Normal behavior. Normal judgment and thought content.   Assessment and Plan:  Pregnancy: G1P0 at [redacted]w[redacted]d 1. Encounter for supervision of normal first pregnancy in third trimester Doing well, f/u US normal growth  2. Group B Streptococcus urinary tract infection affecting pregnancy in third trimester Treat in labor  Preterm labor symptoms and general obstetric precautions including but not limited to vaginal bleeding, contractions, leaking of fluid and fetal movement were reviewed in detail with the patient.  Please refer to After Visit Summary for other counseling recommendations.   Return in about 1 week (around 02/04/2019) for needs cuff, may be virtual if able to check BP.  No future appointments.  Emeterio Reeve, MD

## 2019-01-31 DIAGNOSIS — Z3403 Encounter for supervision of normal first pregnancy, third trimester: Secondary | ICD-10-CM | POA: Diagnosis not present

## 2019-01-31 DIAGNOSIS — Z136 Encounter for screening for cardiovascular disorders: Secondary | ICD-10-CM | POA: Diagnosis not present

## 2019-01-31 LAB — CERVICOVAGINAL ANCILLARY ONLY
Chlamydia: NEGATIVE
Neisseria Gonorrhea: NEGATIVE

## 2019-02-04 ENCOUNTER — Inpatient Hospital Stay (HOSPITAL_COMMUNITY)
Admission: AD | Admit: 2019-02-04 | Discharge: 2019-02-07 | DRG: 806 | Disposition: A | Discharge status: Home / Self Care | Payer: Medicaid Other | Attending: Obstetrics and Gynecology | Admitting: Obstetrics and Gynecology

## 2019-02-04 ENCOUNTER — Other Ambulatory Visit: Payer: Self-pay

## 2019-02-04 ENCOUNTER — Encounter (HOSPITAL_COMMUNITY): Payer: Self-pay | Admitting: *Deleted

## 2019-02-04 ENCOUNTER — Ambulatory Visit (INDEPENDENT_AMBULATORY_CARE_PROVIDER_SITE_OTHER): Payer: Medicaid Other | Admitting: Obstetrics & Gynecology

## 2019-02-04 VITALS — BP 156/100 | HR 67 | Wt 118.3 lb

## 2019-02-04 DIAGNOSIS — D6959 Other secondary thrombocytopenia: Secondary | ICD-10-CM | POA: Diagnosis present

## 2019-02-04 DIAGNOSIS — O0933 Supervision of pregnancy with insufficient antenatal care, third trimester: Secondary | ICD-10-CM

## 2019-02-04 DIAGNOSIS — O9912 Other diseases of the blood and blood-forming organs and certain disorders involving the immune mechanism complicating childbirth: Secondary | ICD-10-CM | POA: Diagnosis present

## 2019-02-04 DIAGNOSIS — D696 Thrombocytopenia, unspecified: Secondary | ICD-10-CM | POA: Diagnosis present

## 2019-02-04 DIAGNOSIS — O0930 Supervision of pregnancy with insufficient antenatal care, unspecified trimester: Secondary | ICD-10-CM

## 2019-02-04 DIAGNOSIS — O26893 Other specified pregnancy related conditions, third trimester: Secondary | ICD-10-CM

## 2019-02-04 DIAGNOSIS — Z1159 Encounter for screening for other viral diseases: Secondary | ICD-10-CM

## 2019-02-04 DIAGNOSIS — Z3A37 37 weeks gestation of pregnancy: Secondary | ICD-10-CM

## 2019-02-04 DIAGNOSIS — O99824 Streptococcus B carrier state complicating childbirth: Secondary | ICD-10-CM | POA: Diagnosis present

## 2019-02-04 DIAGNOSIS — B951 Streptococcus, group B, as the cause of diseases classified elsewhere: Secondary | ICD-10-CM

## 2019-02-04 DIAGNOSIS — O2343 Unspecified infection of urinary tract in pregnancy, third trimester: Secondary | ICD-10-CM

## 2019-02-04 DIAGNOSIS — O1490 Unspecified pre-eclampsia, unspecified trimester: Secondary | ICD-10-CM | POA: Diagnosis present

## 2019-02-04 DIAGNOSIS — O288 Other abnormal findings on antenatal screening of mother: Secondary | ICD-10-CM

## 2019-02-04 DIAGNOSIS — O1414 Severe pre-eclampsia complicating childbirth: Principal | ICD-10-CM | POA: Diagnosis present

## 2019-02-04 DIAGNOSIS — Z3403 Encounter for supervision of normal first pregnancy, third trimester: Secondary | ICD-10-CM

## 2019-02-04 DIAGNOSIS — R519 Headache, unspecified: Secondary | ICD-10-CM

## 2019-02-04 DIAGNOSIS — O134 Gestational [pregnancy-induced] hypertension without significant proteinuria, complicating childbirth: Secondary | ICD-10-CM | POA: Diagnosis not present

## 2019-02-04 DIAGNOSIS — O1413 Severe pre-eclampsia, third trimester: Secondary | ICD-10-CM

## 2019-02-04 HISTORY — DX: Other specified health status: Z78.9

## 2019-02-04 LAB — COMPREHENSIVE METABOLIC PANEL
ALT: 25 U/L (ref 0–44)
AST: 28 U/L (ref 15–41)
Albumin: 2.9 g/dL — ABNORMAL LOW (ref 3.5–5.0)
Alkaline Phosphatase: 177 U/L — ABNORMAL HIGH (ref 38–126)
Anion gap: 10 (ref 5–15)
BUN: 8 mg/dL (ref 6–20)
CO2: 21 mmol/L — ABNORMAL LOW (ref 22–32)
Calcium: 8.8 mg/dL — ABNORMAL LOW (ref 8.9–10.3)
Chloride: 105 mmol/L (ref 98–111)
Creatinine, Ser: 0.76 mg/dL (ref 0.44–1.00)
GFR calc Af Amer: >60 mL/min (ref >60)
GFR calc non Af Amer: >60 mL/min (ref >60)
Glucose, Bld: 73 mg/dL (ref 70–99)
Potassium: 4 mmol/L (ref 3.5–5.1)
Sodium: 136 mmol/L (ref 135–145)
Total Bilirubin: 0.4 mg/dL (ref 0.3–1.2)
Total Protein: 6.8 g/dL (ref 6.5–8.1)

## 2019-02-04 LAB — CBC
HCT: 35.1 % — ABNORMAL LOW (ref 36.0–46.0)
Hemoglobin: 11.6 g/dL — ABNORMAL LOW (ref 12.0–15.0)
MCH: 27.6 pg (ref 26.0–34.0)
MCHC: 33 g/dL (ref 30.0–36.0)
MCV: 83.4 fL (ref 80.0–100.0)
Platelets: 136 10*3/uL — ABNORMAL LOW (ref 150–400)
RBC: 4.21 MIL/uL (ref 3.87–5.11)
RDW: 13.8 % (ref 11.5–15.5)
WBC: 12 10*3/uL — ABNORMAL HIGH (ref 4.0–10.5)
nRBC: 0 % (ref 0.0–0.2)

## 2019-02-04 LAB — PROTEIN / CREATININE RATIO, URINE
Creatinine, Urine: 191.19 mg/dL
Protein Creatinine Ratio: 0.3 mg/mg{Cre} — ABNORMAL HIGH (ref 0.00–0.15)
Total Protein, Urine: 57 mg/dL

## 2019-02-04 LAB — SARS CORONAVIRUS 2: SARS Coronavirus 2: NOT DETECTED

## 2019-02-04 MED ORDER — MISOPROSTOL 50MCG HALF TABLET
50.0000 ug | ORAL_TABLET | ORAL | Status: DC | PRN
  Administered 2019-02-04: 50 ug via BUCCAL
  Filled 2019-02-04: qty 1

## 2019-02-04 MED ORDER — LABETALOL HCL 200 MG PO TABS
200.0000 mg | ORAL_TABLET | Freq: Two times a day (BID) | ORAL | Status: DC
  Administered 2019-02-04 – 2019-02-05 (×2): 200 mg via ORAL
  Filled 2019-02-04 (×2): qty 1

## 2019-02-04 MED ORDER — MISOPROSTOL 25 MCG QUARTER TABLET
ORAL_TABLET | ORAL | Status: AC
  Administered 2019-02-04: 25 ug
  Filled 2019-02-04: qty 1

## 2019-02-04 MED ORDER — ACETAMINOPHEN 500 MG PO TABS
1000.0000 mg | ORAL_TABLET | Freq: Once | ORAL | Status: AC
  Administered 2019-02-04: 1000 mg via ORAL
  Filled 2019-02-04: qty 2

## 2019-02-04 MED ORDER — TERBUTALINE SULFATE 1 MG/ML IJ SOLN: 0.2500 mg | Freq: Once | INTRAMUSCULAR | Status: DC | PRN

## 2019-02-04 MED ORDER — OXYTOCIN 40 UNITS IN NORMAL SALINE INFUSION - SIMPLE MED
2.5000 [IU]/h | INTRAVENOUS | Status: DC
  Administered 2019-02-05: 2.5 [IU]/h via INTRAVENOUS
  Filled 2019-02-04: qty 1000

## 2019-02-04 MED ORDER — MAGNESIUM SULFATE 40 G IN LACTATED RINGERS - SIMPLE
2.0000 g/h | INTRAVENOUS | Status: DC
  Filled 2019-02-04 (×2): qty 500

## 2019-02-04 MED ORDER — LABETALOL HCL 5 MG/ML IV SOLN
20.0000 mg | INTRAVENOUS | Status: DC | PRN
  Administered 2019-02-04: 20 mg via INTRAVENOUS
  Filled 2019-02-04: qty 4

## 2019-02-04 MED ORDER — LACTATED RINGERS IV SOLN: 500.0000 mL | INTRAVENOUS | Status: DC | PRN

## 2019-02-04 MED ORDER — SOD CITRATE-CITRIC ACID 500-334 MG/5ML PO SOLN: 30.0000 mL | ORAL | Status: DC | PRN

## 2019-02-04 MED ORDER — SODIUM CHLORIDE 0.9 % IV SOLN
5.0000 10*6.[IU] | Freq: Once | INTRAVENOUS | Status: AC
  Administered 2019-02-04: 15:00:00 5 10*6.[IU] via INTRAVENOUS
  Filled 2019-02-04: qty 5

## 2019-02-04 MED ORDER — LACTATED RINGERS IV SOLN
INTRAVENOUS | Status: DC
  Administered 2019-02-04: 12:00:00 via INTRAVENOUS

## 2019-02-04 MED ORDER — HYDRALAZINE HCL 20 MG/ML IJ SOLN: 10.0000 mg | INTRAMUSCULAR | Status: DC | PRN

## 2019-02-04 MED ORDER — ONDANSETRON HCL 4 MG/2ML IJ SOLN
4.0000 mg | Freq: Four times a day (QID) | INTRAMUSCULAR | Status: DC | PRN
  Administered 2019-02-05: 4 mg via INTRAVENOUS
  Filled 2019-02-04 (×2): qty 2

## 2019-02-04 MED ORDER — LABETALOL HCL 5 MG/ML IV SOLN: 40.0000 mg | INTRAVENOUS | Status: DC | PRN

## 2019-02-04 MED ORDER — LACTATED RINGERS IV SOLN
INTRAVENOUS | Status: DC
  Administered 2019-02-04 – 2019-02-05 (×2): via INTRAVENOUS

## 2019-02-04 MED ORDER — OXYCODONE-ACETAMINOPHEN 5-325 MG PO TABS: 2.0000 | ORAL_TABLET | ORAL | Status: DC | PRN

## 2019-02-04 MED ORDER — OXYCODONE-ACETAMINOPHEN 5-325 MG PO TABS: 1.0000 | ORAL_TABLET | ORAL | Status: DC | PRN

## 2019-02-04 MED ORDER — OXYTOCIN BOLUS FROM INFUSION: 500.0000 mL | Freq: Once | INTRAVENOUS | Status: DC

## 2019-02-04 MED ORDER — LIDOCAINE HCL (PF) 1 % IJ SOLN: 30.0000 mL | INTRAMUSCULAR | Status: DC | PRN

## 2019-02-04 MED ORDER — PENICILLIN G 3 MILLION UNITS IVPB - SIMPLE MED
3.0000 10*6.[IU] | INTRAVENOUS | Status: DC
  Administered 2019-02-04 – 2019-02-05 (×6): 3 10*6.[IU] via INTRAVENOUS
  Filled 2019-02-04 (×10): qty 100

## 2019-02-04 MED ORDER — FENTANYL CITRATE (PF) 100 MCG/2ML IJ SOLN
100.0000 ug | INTRAMUSCULAR | Status: DC | PRN
  Administered 2019-02-04 – 2019-02-05 (×2): 100 ug via INTRAVENOUS
  Filled 2019-02-04 (×2): qty 2

## 2019-02-04 MED ORDER — ACETAMINOPHEN 325 MG PO TABS: 650.0000 mg | ORAL_TABLET | ORAL | Status: DC | PRN

## 2019-02-04 MED ORDER — FLEET ENEMA 7-19 GM/118ML RE ENEM: 1.0000 | ENEMA | RECTAL | Status: DC | PRN

## 2019-02-04 MED ORDER — LABETALOL HCL 5 MG/ML IV SOLN: 80.0000 mg | INTRAVENOUS | Status: DC | PRN

## 2019-02-04 MED ORDER — MAGNESIUM SULFATE BOLUS VIA INFUSION
4.0000 g | Freq: Once | INTRAVENOUS | Status: AC
  Administered 2019-02-04: 4 g via INTRAVENOUS
  Filled 2019-02-04: qty 500

## 2019-02-04 NOTE — Progress Notes (Signed)
Patient reports fetal movement with pressure. Pt reports headache on and off for the last 3 days, BP is elevated today.

## 2019-02-04 NOTE — Progress Notes (Addendum)
   Briana Carr is a 23 y.o. G1P0 at [redacted]w[redacted]d  admitted for IOL for preeclampsia without severe features. SHe was seen at [facility] and sent to MAU for evaluation; she had severe range pressures while in MAU and IOL was indicated.   Subjective: Doing well; no HA, blurry vision, floating spots, RUQ pain, swelling all over.   Objective: Vitals:   02/04/19 1305 02/04/19 1339 02/04/19 1354 02/04/19 1415  BP: 109/78  125/88   Pulse: (!) 112  79   Resp: 18     Temp:      TempSrc:      SpO2: 99% 99%    Weight:    53.5 kg  Height:    5\' 1"  (1.549 m)   Total I/O In: 400.4 [I.V.:150.4; IV Piggyback:250] Out: -   FHT:  FHR: 130 bpm, variability: moderate,  accelerations:  Present,  decelerations:  Absent UC:   irregular, every 4 minutes SVE:   Dilation: Fingertip Effacement (%): 70 Station: -2 Exam by:: m wilkins rnc  Labs: Lab Results  Component Value Date   WBC 12.0 (H) 02/04/2019   HGB 11.6 (L) 02/04/2019   HCT 35.1 (L) 02/04/2019   MCV 83.4 02/04/2019   PLT 136 (L) 02/04/2019    Assessment / Plan: not in labor yet, just starting induction.  First dose of PCN given; Mag sulfate infusing.  FB placed without incident; vaginal cytotec placed as well.  Labor: not in labor yet, has not received anything yet for contractions,.  Fetal Wellbeing:  Category I Pain Control:  Labor support without medications Anticipated MOD:  NSVD  Briana Carr 02/04/2019, 5:37 PM

## 2019-02-04 NOTE — Progress Notes (Signed)
[facility]: Labor Progress Note  Subjective: Into room to introduce self to patient. Doing well, just got up to go to bathroom. Denies headache, blurry vision, SOB, abdominal pain. Feeling some contractions.   Objective: BP (!) 136/92   Pulse 88   Temp 98.1 F (36.7 C)   Resp 18   Ht 5\' 1"  (1.549 m)   Wt 53.5 kg   LMP 05/16/2018   SpO2 99%   BMI 22.29 kg/m  Gen: well-appearing, NAD Dilation: Fingertip Effacement (%): 70 Cervical Position: Anterior Station: -2 Presentation: Vertex Exam by:: m wilkins rnc  Assessment and Plan: 23 y.o. G1P0 [redacted]w[redacted]d here for IOL for severe preeclampsia.   Labor: Induction started with FB and cytotec. Will continue cytotec until FB out.  -- pain control: open to options -- PPH Risk: medium  Severe Preeclampsia (HA, BP): Asymptomatic now, BP moderate range. Received one dose of IV labetalol with improvement in BP. BP now starting to increase slightly. Labs on admission notable for normal AST/ALT, platelets of 136, UPC 0.30. No signs of Mg++ toxicity, excellent UOP. -- Mg++ -- start labetalol 200mg  BID po -- continue to monitor closely   Fetal Well-Being: EFW 1958g (33%) at 33w5. Cephalic by sutures on prior checks.  -- Category I - continuous fetal monitoring  -- GBS positive - PCN   Laurel S. Juleen China, DO OB/GYN Fellow, Faculty Practice  9:59 PM

## 2019-02-04 NOTE — H&P (Signed)
Obstetric History and Physical  [name] is a 23 y.o. G1P0 at 6821w5d who presents to [facility] for preeclampsia evaluation after she was seen in the office this morning and found to have pressures 140s/90s and 150s/100s. Pt reports she has not been taking her BP at home. Pt reports today was the first time she has had elevated pressures this pregnancy.  Pt reports HA on-and-off for 3days, that is intermittent and worse later in the day. Pt reports she has a HA at the moment, but it is currently getting better. Pt rates HA as 4/10. Pt reports she has not taken anything for HA today. Pt denies history of HA issues.  Pt denies blurry vision/seeing spots, N/V, epigastric pain, swelling in face and hands, sudden weight gain. Pt denies chest pain and SOB.  Pt denies constipation, diarrhea, or urinary problems. Pt denies fever, chills, fatigue, sweating or changes in appetite. Pt denies dizziness, light-headedness, weakness.  Pt denies VB, ctx, LOF and reports good FM.  Blood Type? AB Positive Allergies? NKDA Current medications? PNVs  Prenatal Course [facility] Care[facility] with onset of care at 27 weeks. Pregnancy complications or risks: Patient Active Problem List   Diagnosis Date Noted  . GBS (group B streptococcus) UTI complicating pregnancy 12/03/2018  . Late prenatal care 12/03/2018  . Supervision of normal first pregnancy 11/24/2018   She plans to breastfeed. She desires UNDECIDED for postpartum contraception.   Prenatal labs and studies: ABO, Rh: AB/Positive/-- (04/08 1418) Antibody: Negative (04/08 1418) Rubella: 1.34 (04/08 1418) RPR: Non Reactive (04/17 0938)  HBsAg: Negative (04/08 1418)  HIV: Non Reactive (04/17 0938)  GBS:  Positive in urine 2hr GTT:  69/97/100 (WNL) Genetic screening normal Anatomy US normal, but note made that scan was technically difficult due to advanced gestational age; initial anatomy scan made comment that not all structures were able to  be evaluated, f/u scan marked as normal, but does not mention if anatomy scan was completed.  Prenatal Transfer Tool  Maternal Diabetes: No Genetic Screening: Normal Maternal Ultrasounds/Referrals: Normal Fetal Ultrasounds or other Referrals:  None Maternal Substance Abuse:  No Significant Maternal Medications:  None Significant Maternal Lab Results: Group B Strep positive  Past Medical History:  Diagnosis Date  . Medical history non-contributory     Past Surgical History:  Procedure Laterality Date  . NO PAST SURGERIES      OB History  Gravida Para Term Preterm AB Living  1            SAB TAB Ectopic Multiple Live Births               # Outcome Date GA Lbr Len/2nd Weight Sex Delivery Anes PTL Lv  1 Current             Social History   Socioeconomic History  . Marital status: Single    Spouse name: Not on file  . Number of children: Not on file  . Years of education: Not on file  . Highest education level: Not on file  Occupational History  . Not on file  Social Needs  . Financial resource strain: Not on file  . Food insecurity    Worry: Not on file    Inability: Not on file  . Transportation needs    Medical: Not on file    Non-medical: Not on file  Tobacco Use  . Smoking status: Never Smoker  . Smokeless tobacco: Never Used  Substance and Sexual Activity  .  Alcohol use: Not Currently    Frequency: Never  . Drug use: Never  . Sexual activity: Yes    Partners: Male    Birth control/protection: None  Lifestyle  . Physical activity    Days per week: Not on file    Minutes per session: Not on file  . Stress: Not on file  Relationships  . Social Musicianconnections    Talks on phone: Not on file    Gets together: Not on file    Attends religious service: Not on file    Active member of club or organization: Not on file    Attends meetings of clubs or organizations: Not on file    Relationship status: Not on file  Other Topics Concern  . Not on file   Social History Narrative  . Not on file    Family History  Problem Relation Age of Onset  . Healthy Mother   . Healthy Father     Medications Prior to Admission  Medication Sig Dispense Refill Last Dose  . Prenatal Vit-Fe Phos-FA-Omega (VITAFOL GUMMIES) 3.33-0.333-34.8 MG CHEW Chew 3 tablets by mouth daily before breakfast. 90 tablet 11 02/03/2019 at 1100  . Prenatal MV-Min-FA-Omega-3 (PRENATAL GUMMIES/DHA & FA) 0.4-32.5 MG CHEW Chew by mouth.       No Known Allergies  Review of Systems: Negative except for what is mentioned in HPI.  Physical Exam: BP (!) 125/93 (BP Location: Right Arm)   Pulse 80   Temp 98.2 F (36.8 C) (Oral)   Resp 20   LMP 05/16/2018   SpO2 100%    Patient Vitals for the past 24 hrs:  BP Temp Temp src Pulse Resp SpO2  02/04/19 1230 (!) 125/93 - - 80 - 100 %  02/04/19 1217 (!) 149/111 - - 87 - 100 %  02/04/19 1200 (!) 161/96 - - 66 - 100 %  02/04/19 1150 - - - - - 99 %  02/04/19 1145 (!) 163/101 - - 78 - 99 %  02/04/19 1130 (!) 157/102 - - 75 - 98 %  02/04/19 1120 - - - - - 98 %  02/04/19 1115 (!) 129/103 - - 84 - 99 %  02/04/19 1110 (!) 131/101 98.2 F (36.8 C) Oral 85 20 100 %   CONSTITUTIONAL: Well-developed, well-nourished female in no acute distress.  HENT:  Normocephalic, atraumatic, External right and left ear normal. Oropharynx is clear and moist EYES: Conjunctivae and EOM are normal. Pupils are equal, round, and reactive to light. No scleral icterus.  NECK: Normal range of motion, supple, no masses SKIN: Skin is warm and dry. No rash noted. Not diaphoretic. No erythema. No pallor. NEUROLOGIC: Alert and oriented to person, place, and time. Normal reflexes, muscle tone coordination. No cranial nerve deficit noted. PSYCHIATRIC: Normal mood and affect. Normal behavior. Normal judgment and thought content. CARDIOVASCULAR: Normal heart rate noted, regular rhythm RESPIRATORY: Effort and breath sounds normal, no problems with respiration  noted ABDOMEN: Soft, nontender, nondistended, gravid. MUSCULOSKELETAL: Normal range of motion. No edema and no tenderness. 2+ distal pulses.  Cervical Exam: Dilation: Closed Effacement (%): 80 Exam by:: N. , NP   Presentation: cephalic   EFM:       -baseline: 140       -variability: moderate       -accels: absent       -decels: absent       -TOCO: ctx q32min   Pertinent Labs/Studies:   Results for orders placed or performed during the hospital  encounter of 02/04/19 (from the past 24 hour(s))  Protein / creatinine ratio, urine     Status: Abnormal   Collection Time: 02/04/19 11:00 AM  Result Value Ref Range   Creatinine, Urine 191.19 mg/dL   Total Protein, Urine 57 mg/dL   Protein Creatinine Ratio 0.30 (H) 0.00 - 0.15 mg/mg[Cre]  CBC     Status: Abnormal   Collection Time: 02/04/19 11:40 AM  Result Value Ref Range   WBC 12.0 (H) 4.0 - 10.5 K/uL   RBC 4.21 3.87 - 5.11 MIL/uL   Hemoglobin 11.6 (L) 12.0 - 15.0 g/dL   HCT 35.1 (L) 36.0 - 46.0 %   MCV 83.4 80.0 - 100.0 fL   MCH 27.6 26.0 - 34.0 pg   MCHC 33.0 30.0 - 36.0 g/dL   RDW 13.8 11.5 - 15.5 %   Platelets 136 (L) 150 - 400 K/uL   nRBC 0.0 0.0 - 0.2 %  Comprehensive metabolic panel     Status: Abnormal   Collection Time: 02/04/19 11:40 AM  Result Value Ref Range   Sodium 136 135 - 145 mmol/L   Potassium 4.0 3.5 - 5.1 mmol/L   Chloride 105 98 - 111 mmol/L   CO2 21 (L) 22 - 32 mmol/L   Glucose, Bld 73 70 - 99 mg/dL   BUN 8 6 - 20 mg/dL   Creatinine, Ser 0.76 0.44 - 1.00 mg/dL   Calcium 8.8 (L) 8.9 - 10.3 mg/dL   Total Protein 6.8 6.5 - 8.1 g/dL   Albumin 2.9 (L) 3.5 - 5.0 g/dL   AST 28 15 - 41 U/L   ALT 25 0 - 44 U/L   Alkaline Phosphatase 177 (H) 38 - 126 U/L   Total Bilirubin 0.4 0.3 - 1.2 mg/dL   GFR calc non Af Amer >60 >60 mL/min   GFR calc Af Amer >60 >60 mL/min   Anion gap 10 5 - 15    Assessment : [place] K[place]ik is a 22 y.o. G1P0 at [redacted]w[redacted]d being admitted for induction of labor due to severe  preeclampsia.  Plan: Labor: Expectant management/Induction/Augmentation as ordered as per protocol.  Analgesia as needed. FWB: Reassuring, but non-reactive fetal heart tracing.   GBS positive Delivery plan: Hopeful for vaginal delivery

## 2019-02-04 NOTE — [facility] Provider Note (Signed)
History     CSN: [id number]  Arrival date and time: 02/04/19 1044   First Provider Initiated Contact with Patient 02/04/19 1200      Chief Complaint  Patient presents with  . BP Evaluation   [name] is a 23 y.o. G1P0 at 7480w5d who presents to [facility] for preeclampsia evaluation after she was seen in the office this morning and found to have pressures 140s/90s and 150s/100s. Pt reports she has not been taking her BP at home. Pt reports today was the first time she has had elevated pressures this pregnancy.  Pt reports HA on-and-off for 3days, that is intermittent and worse later in the day. Pt reports she has a HA at the moment, but it is currently getting better. Pt rates HA as 4/10. Pt reports she has not taken anything for HA today. Pt denies history of HA issues.  Pt denies blurry vision/seeing spots, N/V, epigastric pain, swelling in face and hands, sudden weight gain. Pt denies chest pain and SOB.  Pt denies constipation, diarrhea, or urinary problems. Pt denies fever, chills, fatigue, sweating or changes in appetite. Pt denies dizziness, light-headedness, weakness.  Pt denies VB, ctx, LOF and reports good FM.  Current pregnancy problems? none Blood Type? AB Positive Allergies? NKDA Current medications? PNVs Current [facility] & next appt? [facility], next appt 02/14/2019   OB History    Gravida  1   Para      Term      Preterm      AB      Living        SAB      TAB      Ectopic      Multiple      Live Births              Past Medical History:  Diagnosis Date  . Medical history non-contributory     Past Surgical History:  Procedure Laterality Date  . NO PAST SURGERIES      Family History  Problem Relation Age of Onset  . Healthy Mother   . Healthy Father     Social History   Tobacco Use  . Smoking status: Never Smoker  . Smokeless tobacco: Never Used  Substance Use Topics  . Alcohol use: Not Currently    Frequency: Never  . Drug  use: Never    Allergies: No Known Allergies  Medications Prior to Admission  Medication Sig Dispense Refill Last Dose  . Prenatal Vit-Fe Phos-FA-Omega (VITAFOL GUMMIES) 3.33-0.333-34.8 MG CHEW Chew 3 tablets by mouth daily before breakfast. 90 tablet 11 02/03/2019 at 1100  . Prenatal MV-Min-FA-Omega-3 (PRENATAL GUMMIES/DHA & FA) 0.4-32.5 MG CHEW Chew by mouth.       Review of Systems  Constitutional: Negative for chills, diaphoresis, fatigue and fever.  Respiratory: Negative for shortness of breath.   Cardiovascular: Negative for chest pain.  Gastrointestinal: Negative for abdominal pain, constipation, diarrhea, nausea and vomiting.  Genitourinary: Negative for dysuria, flank pain, frequency, pelvic pain, urgency, vaginal bleeding and vaginal discharge.  Neurological: Positive for headaches. Negative for dizziness, weakness and light-headedness.   Physical Exam   Blood pressure (!) 136/93, pulse 85, temperature 98.2 F (36.8 C), temperature source Oral, resp. rate 18, last menstrual period 05/16/2018, SpO2 99 %.  Patient Vitals for the past 24 hrs:  BP Temp Temp src Pulse Resp SpO2  02/04/19 1255 (!) 136/93 - - 85 18 99 %  02/04/19 1250 120/84 - - (!) 101 20 100 %  02/04/19 1245 (!) 120/94 - - 98 19 100 %  02/04/19 1230 (!) 125/93 - - 80 - 100 %  02/04/19 1220 - - - - - 100 %  02/04/19 1217 (!) 149/111 - - 87 - 100 %  02/04/19 1200 (!) 161/96 - - 66 - 100 %  02/04/19 1150 - - - - - 99 %  02/04/19 1145 (!) 163/101 - - 78 - 99 %  02/04/19 1130 (!) 157/102 - - 75 - 98 %  02/04/19 1120 - - - - - 98 %  02/04/19 1115 (!) 129/103 - - 84 - 99 %  02/04/19 1110 (!) 131/101 98.2 F (36.8 C) Oral 85 20 100 %   Physical Exam  Constitutional: She is oriented to person, place, and time. She appears well-developed and well-nourished. No distress.  HENT:  Head: Normocephalic and atraumatic.  Respiratory: Effort normal.  GI: Soft. She exhibits no distension and no mass. There is no  abdominal tenderness. There is no rebound and no guarding.  Neurological: She is alert and oriented to person, place, and time.  Skin: Skin is warm and dry. She is not diaphoretic.  Psychiatric: She has a normal mood and affect. Her behavior is normal. Judgment and thought content normal.   Dilation: Closed Effacement (%): 80 Exam by:: N. Nugent, NP VTX on Bedside US Pt informed that the ultrasound is considered a limited OB ultrasound and is not intended to be a complete ultrasound exam.  Patient also informed that the ultrasound is not being completed with the intent of assessing for fetal or placental anomalies or any pelvic abnormalities.  Explained that the purpose of today's ultrasound is to assess for  presentation.  Patient acknowledges the purpose of the exam and the limitations of the study.  Results for orders placed or performed during the hospital encounter of 02/04/19 (from the past 24 hour(s))  Protein / creatinine ratio, urine     Status: Abnormal   Collection Time: 02/04/19 11:00 AM  Result Value Ref Range   Creatinine, Urine 191.19 mg/dL   Total Protein, Urine 57 mg/dL   Protein Creatinine Ratio 0.30 (H) 0.00 - 0.15 mg/mg[Cre]  CBC     Status: Abnormal   Collection Time: 02/04/19 11:40 AM  Result Value Ref Range   WBC 12.0 (H) 4.0 - 10.5 K/uL   RBC 4.21 3.87 - 5.11 MIL/uL   Hemoglobin 11.6 (L) 12.0 - 15.0 g/dL   HCT 19.135.1 (L) 47.836.0 - 29.546.0 %   MCV 83.4 80.0 - 100.0 fL   MCH 27.6 26.0 - 34.0 pg   MCHC 33.0 30.0 - 36.0 g/dL   RDW 62.113.8 30.811.5 - 65.715.5 %   Platelets 136 (L) 150 - 400 K/uL   nRBC 0.0 0.0 - 0.2 %  Comprehensive metabolic panel     Status: Abnormal   Collection Time: 02/04/19 11:40 AM  Result Value Ref Range   Sodium 136 135 - 145 mmol/L   Potassium 4.0 3.5 - 5.1 mmol/L   Chloride 105 98 - 111 mmol/L   CO2 21 (L) 22 - 32 mmol/L   Glucose, Bld 73 70 - 99 mg/dL   BUN 8 6 - 20 mg/dL   Creatinine, Ser 8.460.76 0.44 - 1.00 mg/dL   Calcium 8.8 (L) 8.9 - 10.3 mg/dL    Total Protein 6.8 6.5 - 8.1 g/dL   Albumin 2.9 (L) 3.5 - 5.0 g/dL   AST 28 15 - 41 U/L   ALT 25 0 - 44  U/L   Alkaline Phosphatase 177 (H) 38 - 126 U/L   Total Bilirubin 0.4 0.3 - 1.2 mg/dL   GFR calc non Af Amer >60 >60 mL/min   GFR calc Af Amer >60 >60 mL/min   Anion gap 10 5 - 15   [place] [facility] Ob Follow Up  Result Date: 01/07/2019 ----------------------------------------------------------------------  OBSTETRICS REPORT                       (Signed Final 01/07/2019 09:21 am) ---------------------------------------------------------------------- Patient Info  ID #:       [id number]                          D.O.B.:  Jan 26, 1996 (22 yrs)  Name:       Briana Carr                     Visit Date: 01/07/2019 09:12 am ---------------------------------------------------------------------- Performed By  Performed By:     Emeline Darling BS,      Ref. Address:     [address]                                                             [place], [place]                                                             [address]  Attending:        Noralee Space MD        Location[facility]  Referred By:      Brock Bad MD ---------------------------------------------------------------------- Orders   #  Description                          Code         Ordered By   1  [place] [facility] OB FOLLOW UP                  [phone]     [facility]  [facility]  ----------------------------------------------------------------------   #  Order #                    Accession #                 Episode #   1  [id number]                  [id number]                  [id number]  ---------------------------------------------------------------------- Indications   [redacted] weeks gestation of pregnancy                Z3A.33   Late to prenatal care, third trimester (Low     O09.33   Risk NIPS)   Antenatal follow-up for nonvisualized fetal    Z36.2   anatomy  ---------------------------------------------------------------------- Vital Signs  Weight (lb): 103                               Height:        5'1"  BMI:         19.46 ---------------------------------------------------------------------- Fetal Evaluation  Num Of Fetuses:         1  Fetal Heart Rate(bpm):  127  Cardiac Activity:       Observed  Presentation:           Cephalic  Placenta:               Anterior  P. Cord Insertion:      Previously Visualized  Amniotic Fluid  AFI FV:      Within normal limits  AFI Sum(cm)     %Tile       Largest Pocket(cm)  10.11           19          3.91  RUQ(cm)       RLQ(cm)       LUQ(cm)        LLQ(cm)  1.1           2.67          3.91           2.43 ---------------------------------------------------------------------- Biometry  BPD:      81.6  mm     G. Age:  32w 6d         21  %    CI:        76.35   %    70 - 86                                                          FL/HC:      20.6   %    19.4 - 21.8  HC:      295.9  mm     G. Age:  32w 5d          5  %    HC/AC:      1.04        0.96 - 1.11  AC:      285.7  mm     G. Age:  32w 4d         22  %    FL/BPD:  74.9   %    71 - 87  FL:       61.1  mm     G. Age:  31w 5d          5  %    FL/AC:      21.4   %    20 - 24  Est. FW:    1958  gm      4 lb 5 oz     33  % ---------------------------------------------------------------------- OB History  Gravidity:    1 ---------------------------------------------------------------------- Gestational Age  LMP:           33w 5d        Date:  05/16/18                 EDD:   02/20/19  U/S Today:     32w 3d                                        EDD:   03/01/19  Best:          33w 5d     Det. By:  LMP  (05/16/18)          EDD:   02/20/19 ---------------------------------------------------------------------- Anatomy  Cranium:               Appears normal         Aortic Arch:            Appears normal   Cavum:                 Previously seen        Ductal Arch:            Appears normal  Ventricles:            Previously seen        Diaphragm:              Appears normal  Choroid Plexus:        Previously seen        Stomach:                Appears normal, left                                                                        sided  Cerebellum:            Previously seen        Abdomen:                Appears normal  Posterior Fossa:       Previously seen        Abdominal Wall:         Not well visualized  Nuchal Fold:           Not applicable (>20    Cord Vessels:           Previously seen                         wks GA)  Face:  Appears normal         Kidneys:                Appear normal                         (orbits and profile)  Lips:                  Appears normal         Bladder:                Appears normal  Thoracic:              Appears normal         Spine:                  Limited views                                                                        appear normal  Heart:                 Appears normal         Upper Extremities:      Appears normal                         (4CH, axis, and                         situs)  RVOT:                  Appears normal         Lower Extremities:      Appears normal  LVOT:                  Appears normal  Other:  Parents do not wish to know sex of fetus. Technically difficult due to          advanced GA and fetal position. ---------------------------------------------------------------------- Cervix Uterus Adnexa  Cervix  Not visualized (advanced GA >24wks) ---------------------------------------------------------------------- Impression  Fetal growth is appropriate for gestational age. Amniotic fluid  is normal and good fetal activity is seen. Good interval growth  is seen. ---------------------------------------------------------------------- Recommendations  Follow-up scans as clinically indicated.  ----------------------------------------------------------------------                  Tama High, MD Electronically Signed Final Report   01/07/2019 09:21 am ----------------------------------------------------------------------  MAU Course  Procedures  MDM -preeclampsia work-up, preeclampsia protocol initiated for severe range pressures -Tylenol 1000mg  given for HA -PCr: 0.3 -CBC: platelets 136, H/H WNL for pregnancy -CMP: AST/ALT 28/25 -UA/COVID testing collected -pt has dx of severe preeclampsia - admit to L&D -EFM: reassuring, but non-reactive       -baseline: 140       -variability: moderate       -accels: absent       -decels: absent       -TOCO: ctx q72min -Dr. Ilda Basset notified, specifically discussed EFM tracing, agrees with plan for admission, no BPP needed, per Dr. Ilda Basset, administer magnesium 4g bolus, 2g maintenance, order entered  Orders Placed This Encounter  Procedures  . SARS Coronavirus 2 (CEPHEID - Performed in [facility] hospital lab), Hosp Order    Standing Status:   Standing    Number of Occurrences:   1    Order Specific Question:   Rule Out    Answer:   Yes  . CBC    Standing Status:   Standing    Number of Occurrences:   1  . Comprehensive metabolic panel    Standing Status:   Standing    Number of Occurrences:   1  . Protein / creatinine ratio, urine    Standing Status:   Standing    Number of Occurrences:   1  . Urinalysis, Routine w reflex microscopic    Standing Status:   Standing    Number of Occurrences:   1  . Notify Physician    Confirmatory reading of BP> 160/110 15 minutes later    Standing Status:   Standing    Number of Occurrences:   1    Order Specific Question:   Notify Physician    Answer:   Temp greater than or equal to 100.4    Order Specific Question:   Notify Physician    Answer:   RR greater than 24 or less than 10    Order Specific Question:   Notify Physician    Answer:   HR greater than 120 or less than 50    Order  Specific Question:   Notify Physician    Answer:   SBP greater than 160 mmHG or less than 80 mmHG    Order Specific Question:   Notify Physician    Answer:   DBP greater than 110 mmHG or less than 45 mmHG    Order Specific Question:   Notify Physician    Answer:   Urinary output is less than for any 4 hour period  . Measure blood pressure    20 minutes after giving hydralazine 10 MG IV dose.  Call MD if SBP >/= 160 or DBP >/= 110.    Standing Status:   Standing    Number of Occurrences:   1  . Insert peripheral IV    Standing Status:   Standing    Number of Occurrences:   1   Meds ordered this encounter  Medications  . AND Linked Order Group   . labetalol (NORMODYNE) injection 20 mg   . labetalol (NORMODYNE) injection 40 mg   . labetalol (NORMODYNE) injection 80 mg   . hydrALAZINE (APRESOLINE) injection 10 mg  . acetaminophen (TYLENOL) tablet 1,000 mg  . lactated ringers infusion  . magnesium bolus via infusion 4 g  . magnesium sulfate 40 grams in LR 500 mL OB infusion   Assessment and Plan   -admit to L&D for delivery -Luna Kitchens, CNM, notified -H&P written and signed  Odie Sera Nugent 02/04/2019, 12:57 PM

## 2019-02-04 NOTE — MAU Note (Signed)
Pt sent from [facility] for BP evaluation.  Reports has had intermittent H/A x3 days.  Denies visual disturbances or epigastric pain.  Denies VB or LOF.  Reports +FM.

## 2019-02-04 NOTE — Progress Notes (Signed)
   PRENATAL VISIT NOTE  Subjective:  Briana Carr is a 23 y.o. G1P0 at [redacted]w[redacted]d being seen today for ongoing prenatal care.  She is currently monitored for the following issues for this low-risk pregnancy and has Supervision of normal first pregnancy; GBS (group B streptococcus) UTI complicating pregnancy; and Late prenatal care on their problem list.  Patient reports headache and epigastric pain for almost a week.  Contractions: Not present. Vag. Bleeding: None.  Movement: Present. Denies leaking of fluid.   The following portions of the patient's history were reviewed and updated as appropriate: allergies, current medications, past family history, past medical history, past social history, past surgical history and problem list.   Objective:   Vitals:   02/04/19 0929 02/04/19 0934  BP: (!) 145/92 (!) 156/100  Pulse: 80 67  Weight: 118 lb 4.8 oz (53.7 kg)     Fetal Status: Fetal Heart Rate (bpm): 150   Movement: Present     General:  Alert, oriented and cooperative. Patient is in no acute distress.  Skin: Skin is warm and dry. No rash noted.   Cardiovascular: Normal heart rate noted  Respiratory: Normal respiratory effort, no problems with respiration noted  Abdomen: Soft, gravid, appropriate for gestational age.  Pain/Pressure: Present     Pelvic: Cervical exam deferred        Extremities: Normal range of motion.  Edema: None  Mental Status: Normal mood and affect. Normal behavior. Normal judgment and thought content.   Assessment and Plan:  Pregnancy: G1P0 at [redacted]w[redacted]d 1. Encounter for supervision of normal first pregnancy in third trimester - she does not have a BP cuff at home.  2. Group B Streptococcus urinary tract infection affecting pregnancy in third trimester - treat in labor  3. Late prenatal care 4. Elevated BP today with headache and epigastric pain for a week- she will go directly to the MAU for further evaluation  Preterm labor symptoms and general obstetric  precautions including but not limited to vaginal bleeding, contractions, leaking of fluid and fetal movement were reviewed in detail with the patient. Please refer to After Visit Summary for other counseling recommendations.   No follow-ups on file.  No future appointments.  Emily Filbert, MD

## 2019-02-05 ENCOUNTER — Inpatient Hospital Stay (HOSPITAL_COMMUNITY): Payer: Medicaid Other | Admitting: Anesthesiology

## 2019-02-05 ENCOUNTER — Encounter (HOSPITAL_COMMUNITY): Payer: Self-pay | Admitting: Emergency Medicine

## 2019-02-05 DIAGNOSIS — Z3A37 37 weeks gestation of pregnancy: Secondary | ICD-10-CM

## 2019-02-05 DIAGNOSIS — O1414 Severe pre-eclampsia complicating childbirth: Secondary | ICD-10-CM

## 2019-02-05 LAB — RPR: RPR Ser Ql: NONREACTIVE

## 2019-02-05 LAB — COMPREHENSIVE METABOLIC PANEL
ALT: 32 U/L (ref 0–44)
AST: 39 U/L (ref 15–41)
Albumin: 2.6 g/dL — ABNORMAL LOW (ref 3.5–5.0)
Alkaline Phosphatase: 201 U/L — ABNORMAL HIGH (ref 38–126)
Anion gap: 10 (ref 5–15)
BUN: 6 mg/dL (ref 6–20)
CO2: 22 mmol/L (ref 22–32)
Calcium: 7.1 mg/dL — ABNORMAL LOW (ref 8.9–10.3)
Chloride: 103 mmol/L (ref 98–111)
Creatinine, Ser: 0.74 mg/dL (ref 0.44–1.00)
GFR calc Af Amer: >60 mL/min (ref >60)
GFR calc non Af Amer: >60 mL/min (ref >60)
Glucose, Bld: 107 mg/dL — ABNORMAL HIGH (ref 70–99)
Potassium: 4 mmol/L (ref 3.5–5.1)
Sodium: 135 mmol/L (ref 135–145)
Total Bilirubin: 0.5 mg/dL (ref 0.3–1.2)
Total Protein: 6.5 g/dL (ref 6.5–8.1)

## 2019-02-05 LAB — CBC
HCT: 35.6 % — ABNORMAL LOW (ref 36.0–46.0)
HCT: 37.1 % (ref 36.0–46.0)
Hemoglobin: 12 g/dL (ref 12.0–15.0)
Hemoglobin: 12.3 g/dL (ref 12.0–15.0)
MCH: 28.1 pg (ref 26.0–34.0)
MCH: 27.8 pg (ref 26.0–34.0)
MCHC: 33.7 g/dL (ref 30.0–36.0)
MCHC: 33.2 g/dL (ref 30.0–36.0)
MCV: 83.4 fL (ref 80.0–100.0)
MCV: 83.7 fL (ref 80.0–100.0)
Platelets: 133 10*3/uL — ABNORMAL LOW (ref 150–400)
Platelets: 134 10*3/uL — ABNORMAL LOW (ref 150–400)
RBC: 4.27 MIL/uL (ref 3.87–5.11)
RBC: 4.43 MIL/uL (ref 3.87–5.11)
RDW: 13.8 % (ref 11.5–15.5)
RDW: 14.1 % (ref 11.5–15.5)
WBC: 14.8 10*3/uL — ABNORMAL HIGH (ref 4.0–10.5)
WBC: 18.7 10*3/uL — ABNORMAL HIGH (ref 4.0–10.5)
nRBC: 0 % (ref 0.0–0.2)
nRBC: 0 % (ref 0.0–0.2)

## 2019-02-05 LAB — ABO/RH: ABO/RH(D): AB POS

## 2019-02-05 LAB — TYPE AND SCREEN
ABO/RH(D): AB POS
Antibody Screen: NEGATIVE

## 2019-02-05 MED ORDER — OXYCODONE HCL 5 MG PO TABS: 10.0000 mg | ORAL_TABLET | ORAL | Status: DC | PRN

## 2019-02-05 MED ORDER — EPHEDRINE 5 MG/ML INJ
10.0000 mg | INTRAVENOUS | Status: DC | PRN
  Filled 2019-02-05: qty 2

## 2019-02-05 MED ORDER — LIDOCAINE HCL (PF) 1 % IJ SOLN
INTRAMUSCULAR | Status: DC | PRN
  Administered 2019-02-05 (×2): 5 mL via EPIDURAL

## 2019-02-05 MED ORDER — WITCH HAZEL-GLYCERIN EX PADS: 1.0000 "application " | MEDICATED_PAD | CUTANEOUS | Status: DC | PRN

## 2019-02-05 MED ORDER — SIMETHICONE 80 MG PO CHEW
80.0000 mg | CHEWABLE_TABLET | ORAL | Status: DC | PRN
  Administered 2019-02-07: 08:00:00 80 mg via ORAL
  Filled 2019-02-05: qty 1

## 2019-02-05 MED ORDER — PHENYLEPHRINE 40 MCG/ML (10ML) SYRINGE FOR IV PUSH (FOR BLOOD PRESSURE SUPPORT)
80.0000 ug | PREFILLED_SYRINGE | INTRAVENOUS | Status: DC | PRN
  Filled 2019-02-05: qty 10

## 2019-02-05 MED ORDER — OXYCODONE HCL 5 MG PO TABS
5.0000 mg | ORAL_TABLET | ORAL | Status: DC | PRN
  Administered 2019-02-05 – 2019-02-06 (×2): 5 mg via ORAL
  Filled 2019-02-05 (×2): qty 1

## 2019-02-05 MED ORDER — ZOLPIDEM TARTRATE 5 MG PO TABS: 5.0000 mg | ORAL_TABLET | Freq: Every evening | ORAL | Status: DC | PRN

## 2019-02-05 MED ORDER — LACTATED RINGERS IV SOLN
INTRAVENOUS | Status: DC
  Administered 2019-02-05 – 2019-02-06 (×2): via INTRAVENOUS

## 2019-02-05 MED ORDER — BENZOCAINE-MENTHOL 20-0.5 % EX AERO
1.0000 "application " | INHALATION_SPRAY | CUTANEOUS | Status: DC | PRN
  Administered 2019-02-05: 1 via TOPICAL
  Filled 2019-02-05: qty 56

## 2019-02-05 MED ORDER — LACTATED RINGERS IV SOLN: 500.0000 mL | Freq: Once | INTRAVENOUS | Status: DC

## 2019-02-05 MED ORDER — ONDANSETRON HCL 4 MG/2ML IJ SOLN
4.0000 mg | INTRAMUSCULAR | Status: DC | PRN
  Administered 2019-02-05: 4 mg via INTRAVENOUS
  Filled 2019-02-05: qty 2

## 2019-02-05 MED ORDER — DIPHENHYDRAMINE HCL 50 MG/ML IJ SOLN: 12.5000 mg | INTRAMUSCULAR | Status: DC | PRN

## 2019-02-05 MED ORDER — FENTANYL-BUPIVACAINE-NACL 0.5-0.125-0.9 MG/250ML-% EP SOLN: 12.0000 mL/h | EPIDURAL | Status: DC | PRN

## 2019-02-05 MED ORDER — SODIUM CHLORIDE (PF) 0.9 % IJ SOLN
INTRAMUSCULAR | Status: DC | PRN
  Administered 2019-02-05: 12 mL/h via EPIDURAL

## 2019-02-05 MED ORDER — MAGNESIUM SULFATE 40 G IN LACTATED RINGERS - SIMPLE
2.0000 g/h | INTRAVENOUS | Status: AC
  Administered 2019-02-05: 2 g/h via INTRAVENOUS
  Filled 2019-02-05: qty 500

## 2019-02-05 MED ORDER — OXYTOCIN 40 UNITS IN NORMAL SALINE INFUSION - SIMPLE MED
1.0000 m[IU]/min | INTRAVENOUS | Status: DC
  Administered 2019-02-05: 02:00:00 2 m[IU]/min via INTRAVENOUS

## 2019-02-05 MED ORDER — SENNOSIDES-DOCUSATE SODIUM 8.6-50 MG PO TABS: 2.0000 | ORAL_TABLET | Freq: Every evening | ORAL | Status: DC | PRN

## 2019-02-05 MED ORDER — COCONUT OIL OIL: 1.0000 "application " | TOPICAL_OIL | Status: DC | PRN

## 2019-02-05 MED ORDER — DIPHENHYDRAMINE HCL 25 MG PO CAPS: 25.0000 mg | ORAL_CAPSULE | Freq: Four times a day (QID) | ORAL | Status: DC | PRN

## 2019-02-05 MED ORDER — ONDANSETRON HCL 4 MG PO TABS: 4.0000 mg | ORAL_TABLET | ORAL | Status: DC | PRN

## 2019-02-05 MED ORDER — ACETAMINOPHEN 325 MG PO TABS
650.0000 mg | ORAL_TABLET | ORAL | Status: DC | PRN
  Administered 2019-02-06 – 2019-02-07 (×3): 650 mg via ORAL
  Filled 2019-02-05 (×3): qty 2

## 2019-02-05 MED ORDER — TETANUS-DIPHTH-ACELL PERTUSSIS 5-2.5-18.5 LF-MCG/0.5 IM SUSP: 0.5000 mL | Freq: Once | INTRAMUSCULAR | Status: DC

## 2019-02-05 MED ORDER — PRENATAL MULTIVITAMIN CH
1.0000 | ORAL_TABLET | Freq: Every day | ORAL | Status: DC
  Administered 2019-02-07: 08:00:00 1 via ORAL
  Filled 2019-02-05: qty 1

## 2019-02-05 MED ORDER — TERBUTALINE SULFATE 1 MG/ML IJ SOLN: 0.2500 mg | Freq: Once | INTRAMUSCULAR | Status: DC | PRN

## 2019-02-05 MED ORDER — FENTANYL-BUPIVACAINE-NACL 0.5-0.125-0.9 MG/250ML-% EP SOLN
12.0000 mL/h | EPIDURAL | Status: DC | PRN
  Filled 2019-02-05: qty 250

## 2019-02-05 MED ORDER — SENNOSIDES-DOCUSATE SODIUM 8.6-50 MG PO TABS: 2.0000 | ORAL_TABLET | ORAL | Status: DC

## 2019-02-05 MED ORDER — DIBUCAINE (PERIANAL) 1 % EX OINT: 1.0000 "application " | TOPICAL_OINTMENT | CUTANEOUS | Status: DC | PRN

## 2019-02-05 NOTE — Progress Notes (Signed)
OB/GYN Faculty Practice: Labor Progress Note  Subjective: Doing well, has been able to get some sleep.   Objective: BP 110/72   Pulse (!) 217   Temp 97.8 F (36.6 C) (Oral)   Resp 17   Ht 5\' 1"  (1.549 m)   Wt 53.5 kg   LMP 05/16/2018   SpO2 99%   BMI 22.29 kg/m  Gen: tired-appearing, NAD Dilation: 5.5 Effacement (%): 80, 90 Cervical Position: Anterior Station: -2, -1 Presentation: Vertex Exam by:: L.Mears,RN  Assessment and Plan: 23 y.o. G1P0 [redacted]w[redacted]d here for IOL for severe preeclampsia.   Labor: Continue pitocin per protocol. Contraction pattern somewhat regular, will continue to titrate pitocin per protocol.  -- pain control: open to options -- PPH Risk: medium  Severe Preeclampsia (HA, BP): Asymptomatic, BP normal to moderate range.  Labs on admission notable for normal AST/ALT, platelets of 136, UPC 0.30. No signs of Mg++ toxicity, excellent UOP. -- Mg++ -- labetalol 200mg  BID po -- continue to monitor closely   Fetal Well-Being: EFW 1958g (33%) at 33w5. Cephalic by sutures on prior checks.  -- Category I - continuous fetal monitoring  -- GBS positive - PCN   Tamey Wanek S. Juleen China, DO OB/GYN Fellow, Faculty Practice  7:24 AM

## 2019-02-05 NOTE — Anesthesia Preprocedure Evaluation (Signed)
Anesthesia Evaluation  Patient identified by MRN, date of birth, ID band Patient awake    Reviewed: Allergy & Precautions, H&P , NPO status , Patient's Chart, lab work & pertinent test results  History of Anesthesia Complications Negative for: history of anesthetic complications  Airway Mallampati: II  TM Distance: >3 FB Neck ROM: full    Dental no notable dental hx. (+) Teeth Intact   Pulmonary neg pulmonary ROS,    Pulmonary exam normal breath sounds clear to auscultation       Cardiovascular negative cardio ROS Normal cardiovascular exam Rhythm:regular Rate:Normal     Neuro/Psych negative neurological ROS  negative psych ROS   GI/Hepatic negative GI ROS, Neg liver ROS,   Endo/Other  negative endocrine ROS  Renal/GU negative Renal ROS  negative genitourinary   Musculoskeletal   Abdominal   Peds  Hematology negative hematology ROS (+)   Anesthesia Other Findings   Reproductive/Obstetrics (+) Pregnancy (PIH)                             Anesthesia Physical Anesthesia Plan  ASA: II  Anesthesia Plan: Epidural   Post-op Pain Management:    Induction:   PONV Risk Score and Plan:   Airway Management Planned:   Additional Equipment:   Intra-op Plan:   Post-operative Plan:   Informed Consent: I have reviewed the patients History and Physical, chart, labs and discussed the procedure including the risks, benefits and alternatives for the proposed anesthesia with the patient or authorized representative who has indicated his/her understanding and acceptance.       Plan Discussed with:   Anesthesia Plan Comments:         Anesthesia Quick Evaluation

## 2019-02-05 NOTE — Progress Notes (Signed)
Pt arrived to the floor. Fundus is firm @U -2 with small lochia. Pt states she is having abdominal cramping 6/10 on pain scale. Mag is infusing at 2grams/hour and Pitocin is infusing at 62.5. Report will be given to oncoming shift. Toya Smothers, RN

## 2019-02-05 NOTE — Discharge Summary (Signed)
Postpartum Discharge Summary     Patient Name: Briana Carr DOB: 04-05-1996 MRN: [id number]  Date of admission: 02/04/2019 Delivering Provider: [name] A   Date of discharge: 02/07/2019  Admitting diagnosis: 89WKS ELEVATED BP Intrauterine pregnancy: [redacted]w[redacted]d     Secondary diagnosis:  Active Problems:   Preeclampsia   NSVD (normal spontaneous vaginal delivery)   Second degree perineal laceration during delivery   Gestational thrombocytopenia [facility])  Additional problems: None     Discharge diagnosis: Term Pregnancy Delivered                                                                                                Post partum procedures:Magnesium x 24 hrs, Depo Provera on day of discharge  Augmentation: AROM, Pitocin, Cytotec and Foley Balloon  Complications: None  Hospital course:  Induction of Labor With Vaginal Delivery   23 y.o. yo G1P0 at [redacted]w[redacted]d was admitted to the hospital 02/04/2019 for induction of labor.  Indication for induction: Preeclampsia.  Patient had an uncomplicated labor course as follows: Membrane Rupture Time/Date: 11:22 AM ,02/05/2019   Intrapartum Procedures: Episiotomy: None [1]                                         Lacerations:  2nd degree [3];Perineal [11]  Patient had delivery of a Viable infant.  Information for the patient's newborn:  Jessa, Stinson [[id number]]  Delivery Method: Vag-Spont    02/05/2019  Details of delivery can be found in separate delivery note.  Patient received magnesium x 24 hrs. Was started on Vasotec. Progressed to ambulating, voiding, tolerating diet and good oral pain control. No S/Sx of SPEC. Breast/Bottle feeding. Felt amendable for discharge home on PPD # 2. Discharge instructions, BP monitoring and medications reviewed with pt. Pt verbalized understanding.  Patient is discharged home 02/07/19.  Magnesium Sulfate recieved: Yes BMZ received: No  Physical exam  Vitals:   02/07/19 0628 02/07/19 0650 02/07/19  0656 02/07/19 0728  BP: (!) 164/87 (!) 164/88 134/77 138/74  Pulse:  67 71 74  Resp:    16  Temp:    98.3 F (36.8 C)  TempSrc:    Oral  SpO2:    100%  Weight:      Height:       General: alert Lochia: appropriate Uterine Fundus: firm Incision: Healing well with no significant drainage DVT Evaluation: No evidence of DVT seen on physical exam. Labs: Lab Results  Component Value Date   WBC 19.3 (H) 02/06/2019   HGB 11.0 (L) 02/06/2019   HCT 32.8 (L) 02/06/2019   MCV 83.7 02/06/2019   PLT 130 (L) 02/06/2019   CMP Latest Ref Rng & Units 02/06/2019  Glucose 70 - 99 mg/dL 89  BUN 6 - 20 mg/dL <5(L)  Creatinine 0.44 - 1.00 mg/dL 0.82  Sodium 135 - 145 mmol/L 135  Potassium 3.5 - 5.1 mmol/L 4.5  Chloride 98 - 111 mmol/L 102  CO2 22 - 32 mmol/L 24  Calcium 8.9 -  10.3 mg/dL 6.4(LL)  Total Protein 6.5 - 8.1 g/dL 1.6(X5.4(L)  Total Bilirubin 0.3 - 1.2 mg/dL 0.5  Alkaline Phos 38 - 126 U/L 154(H)  AST 15 - 41 U/L 38  ALT 0 - 44 U/L 29    Discharge instruction: per After Visit Summary and "Baby and Me Booklet".  After visit meds:  Allergies as of 02/07/2019   No Known Allergies     Medication List    STOP taking these medications   Vitafol Gummies 3.33-0.333-34.8 MG Chew     TAKE these medications   enalapril 10 MG tablet Commonly known as: VASOTEC Take 1 tablet (10 mg total) by mouth daily. Start taking on: February 08, 2019   ibuprofen 800 MG tablet Commonly known as: ADVIL Take 1 tablet (800 mg total) by mouth 3 (three) times daily with meals as needed for headache or moderate pain.   Prenatal Gummies/DHA & FA 0.4-32.5 MG Chew Chew by mouth.       Diet: routine diet  Activity: Advance as tolerated. Pelvic rest for 6 weeks.   Outpatient follow up:1 week for BP check and 4 weeks for PP visit Follow up Appt: Future Appointments  Date Time Provider Department Center  02/15/2019  3:15 PM [facility] NURSE [facility] None  03/10/2019  1:00 PM Sharyon Cableogers, Veronica C, CNM [facility]  None   Follow up Visit: [facility] Information    [facility].   Why: 1 week for BP check and 4 weeks for PP visit Contact information: [address] [place] [phone]          Sent to [facility] 02/05/19:  Please schedule this patient for PP visit in: 4 weeks  High risk pregnancy complicated by: HTN  Delivery mode: SVD  Anticipated Birth Control: other/unsure  PP Procedures needed: BP check  Schedule Integrated BH visit: no  Provider: Any provider       Newborn Data: Live born female  Birth Weight:   APGAR: 8, 9  Newborn Delivery   Birth date/time: 02/05/2019 17:00:00 Delivery type: Vaginal, Spontaneous      Baby Feeding: Bottle/Breast Disposition:home with mother   02/07/2019 Hermina StaggersMichael L Gino Garrabrant, MD

## 2019-02-05 NOTE — Lactation Note (Signed)
This note was copied from a baby's chart. Lactation Consultation Note  Patient Name: Briana Carr Today's Date: 02/05/2019 Reason for consult: Initial assessment;Primapara;1st time breastfeeding;Infant < 6lbs;Early term 37-38.6wks  6 hours old ETI female < 6 lbs who is now being partially BF and formula fed by his mother, she's a P1. She reported (+) breast changes during the pregnancy. When [facility] assisted mom with hand expression, colostrum was obtained very easily, however mom was very tired and sleepy, she's currently on Mag, and baby had to be taken to the nursery due to low temps.  Mom hasn't started pumping yet, LC set up a DEBP, instructions, cleaning and storage were reviewed as well as milk storage guidelines. Reviewed LPI policy and supplementation guidelines for LPIs, baby is currently on Similac 22 calorie formula. Discussed normal newborn behavior and feeding cues. Mom's support person is GOB but she was asleep on the couch.   Feeding plan:  1. Encouraged mom to start pumping every 3 hours and at least once at night. Mom aware that pumping this early on is mainly for breast stimulation and not to get volume. However, due to status, mom may not start pumping until tomorrow morning 2. Hand expression and breast massage were also encouraged 3. Once baby is reunited with mom, she was encouraged to BF STS 8-12 times/24 hours or sooner if feeding cues are present 4. Baby will get supplemented with Similac 22 calorie formula after feedings at the breast, according to LPI guidelines per baby's age in hours  BF brochure, BF resources and feeding diary were reviewed. Mom reported all questions and concerns were answered, she's aware of [facility] OP services and will call PRN.    Maternal Data Formula Feeding for Exclusion: No Has patient been taught Hand Expression?: Yes Does the patient have breastfeeding experience prior to this delivery?: No  Feeding Feeding Type: Bottle Fed - Formula Nipple  Type: Slow - flow   Interventions Interventions: Breast feeding basics reviewed;Breast massage;Hand express;Breast compression;DEBP  Lactation Tools Discussed/Used Tools: Pump Breast pump type: Double-Electric Breast Pump WIC Program: No Pump Review: Setup, frequency, and cleaning Initiated by:: MPeck Date initiated:: 02/05/19   Consult Status Consult Status: Follow-up Date: 02/06/19 Follow-up type: In-patient    Briana Carr Briana Carr 02/05/2019, 11:03 PM

## 2019-02-05 NOTE — Progress Notes (Signed)
Briana Carr is a 23 y.o. G1P0 at [redacted]w[redacted]d by ultrasound admitted for induction of labor due to preeclampsia.  Subjective: Pt comfortable with epidural.  Family in room for support.  Objective: BP (!) 134/96   Pulse 78   Temp 97.7 F (36.5 C) (Oral)   Resp 18   Ht 5\' 1"  (1.549 m)   Wt 53.5 kg   LMP 05/16/2018   SpO2 100%   BMI 22.29 kg/m  I/O last 3 completed shifts: In: 1625.4 [P.O.:500; I.V.:775.4; IV Piggyback:350] Out: 2600 [Urine:2600] Total I/O In: 3089.7 [P.O.:240; I.V.:2449.7; IV Piggyback:400] Out: 1125 [Urine:1125]  FHT:  FHR: 110 bpm, variability: moderate,  accelerations:  Abscent,  decelerations:  Present early UC:   regular, every 2-4 minutes, 180- 200 MVUs SVE:   Dilation: 7 Effacement (%): 100 Station: Plus 2, Plus 3 Exam by:: Leftwich-Kirby, CNM   Labs: Lab Results  Component Value Date   WBC 14.8 (H) 02/05/2019   HGB 12.0 02/05/2019   HCT 35.6 (L) 02/05/2019   MCV 83.4 02/05/2019   PLT 133 (L) 02/05/2019    Assessment / Plan: Induction of labor due to preeclampsia,  progressing well on pitocin  Labor: Progressing normally Preeclampsia:  labs stable Fetal Wellbeing:  Category I Pain Control:  Epidural I/D:  GBS positive on PCN Anticipated MOD:  NSVD  [name] 02/05/2019, 2:48 PM

## 2019-02-05 NOTE — Anesthesia Procedure Notes (Signed)
Epidural Patient location during procedure: OB  Staffing Anesthesiologist: Mikalyn Hermida, MD Performed: anesthesiologist   Preanesthetic Checklist Completed: patient identified, site marked, surgical consent, pre-op evaluation, timeout performed, IV checked, risks and benefits discussed and monitors and equipment checked  Epidural Patient position: sitting Prep: DuraPrep Patient monitoring: heart rate, continuous pulse ox and blood pressure Approach: right paramedian Location: L3-L4 Injection technique: LOR saline  Needle:  Needle type: Tuohy  Needle gauge: 17 G Needle length: 9 cm and 9 Needle insertion depth: 6 cm Catheter type: closed end flexible Catheter size: 20 Guage Catheter at skin depth: 10 cm Test dose: negative  Assessment Events: blood not aspirated, injection not painful, no injection resistance, negative IV test and no paresthesia  Additional Notes Patient identified. Risks/Benefits/Options discussed with patient including but not limited to bleeding, infection, nerve damage, paralysis, failed block, incomplete pain control, headache, blood pressure changes, nausea, vomiting, reactions to medication both or allergic, itching and postpartum back pain. Confirmed with bedside nurse the patient's most recent platelet count. Confirmed with patient that they are not currently taking any anticoagulation, have any bleeding history or any family history of bleeding disorders. Patient expressed understanding and wished to proceed. All questions were answered. Sterile technique was used throughout the entire procedure. Please see nursing notes for vital signs. Test dose was given through epidural needle and negative prior to continuing to dose epidural or start infusion. Warning signs of high block given to the patient including shortness of breath, tingling/numbness in hands, complete motor block, or any concerning symptoms with instructions to call for help. Patient was given  instructions on fall risk and not to get out of bed. All questions and concerns addressed with instructions to call with any issues.     

## 2019-02-05 NOTE — Progress Notes (Signed)
Briana Carr is a 23 y.o. G1P0 at [redacted]w[redacted]d by ultrasound admitted for induction of labor due to preeclampsia.  Subjective: Pt breathing with contractions, coping well.  Objective: BP 112/75   Pulse 87   Temp 97.8 F (36.6 C) (Oral)   Resp 18   Ht 5\' 1"  (1.549 m)   Wt 53.5 kg   LMP 05/16/2018   SpO2 99%   BMI 22.29 kg/m  I/O last 3 completed shifts: In: 1625.4 [P.O.:500; I.V.:775.4; IV Piggyback:350] Out: 2600 [Urine:2600] Total I/O In: 240 [P.O.:240] Out: 700 [Urine:700]  FHT:  FHR: 130 bpm, variability: moderate,  accelerations:  Present,  decelerations:  Absent initially, variables after AROM UC:   regular, every 1.5-2 minutes SVE:   Dilation: (P) 6 Effacement (%): (P) 80 Station: (P) -1 Exam by:: (P) Leftwich-Kirby, CNM   AROM with light meconium. IUPC placed. Pt tolerated well.  Labs: Lab Results  Component Value Date   WBC 14.8 (H) 02/05/2019   HGB 12.0 02/05/2019   HCT 35.6 (L) 02/05/2019   MCV 83.4 02/05/2019   PLT 133 (L) 02/05/2019    Assessment / Plan: Induction of labor due to preeclampsia,  progressing well on pitocin  Labor: No cervical dilation in 6+ hours but some thinning.  AROM and IUPC placed.    Preeclampsia:  on magnesium sulfate Fetal Wellbeing:  Category II after AROM Pain Control:  Labor support without medications I/D:  GBS positive on PCN Anticipated MOD:  NSVD  [name] 02/05/2019, 11:27 AM

## 2019-02-05 NOTE — Progress Notes (Signed)
OB/GYN Faculty Practice: Labor Progress Note  Subjective: Strip note. Plan of care discussed with RN. FB now out, cervix well-effaced and progressing.   Objective: BP (!) 128/94   Pulse 79   Temp 98.1 F (36.7 C)   Resp 16   Ht 5\' 1"  (1.549 m)   Wt 53.5 kg   LMP 05/16/2018   SpO2 99%   BMI 22.29 kg/m  Gen: strip note Dilation: Fingertip Effacement (%): 70 Cervical Position: Anterior Station: -2 Presentation: Vertex Exam by:: m wilkins rnc  Assessment and Plan: 23 y.o. G1P0 [redacted]w[redacted]d here for IOL for severe preeclampsia.   Labor: FB out. Will transition to pitocin and titrate per protocol.  -- pain control: open to options -- PPH Risk: medium  Severe Preeclampsia (HA, BP): Asymptomatic, BP normal to moderate range.  Labs on admission notable for normal AST/ALT, platelets of 136, UPC 0.30. No signs of Mg++ toxicity, excellent UOP. -- Mg++ -- labetalol 200mg  BID po -- continue to monitor closely   Fetal Well-Being: EFW 1958g (33%) at 33w5. Cephalic by sutures on prior checks.  -- Category I - continuous fetal monitoring - less variability but likely related to Mg++ and sleep cycle, no decelerations  -- GBS positive - PCN   Briana Fass S. Juleen China, DO OB/GYN Fellow, Faculty Practice  2:22 AM

## 2019-02-06 DIAGNOSIS — D696 Thrombocytopenia, unspecified: Secondary | ICD-10-CM | POA: Diagnosis present

## 2019-02-06 LAB — COMPREHENSIVE METABOLIC PANEL
ALT: 29 U/L (ref 0–44)
AST: 38 U/L (ref 15–41)
Albumin: 2.1 g/dL — ABNORMAL LOW (ref 3.5–5.0)
Alkaline Phosphatase: 154 U/L — ABNORMAL HIGH (ref 38–126)
Anion gap: 9 (ref 5–15)
BUN: <5 mg/dL — ABNORMAL LOW (ref 6–20)
CO2: 24 mmol/L (ref 22–32)
Calcium: 6.4 mg/dL — CL (ref 8.9–10.3)
Chloride: 102 mmol/L (ref 98–111)
Creatinine, Ser: 0.82 mg/dL (ref 0.44–1.00)
GFR calc Af Amer: >60 mL/min (ref >60)
GFR calc non Af Amer: >60 mL/min (ref >60)
Glucose, Bld: 89 mg/dL (ref 70–99)
Potassium: 4.5 mmol/L (ref 3.5–5.1)
Sodium: 135 mmol/L (ref 135–145)
Total Bilirubin: 0.5 mg/dL (ref 0.3–1.2)
Total Protein: 5.4 g/dL — ABNORMAL LOW (ref 6.5–8.1)

## 2019-02-06 LAB — CBC
HCT: 32.8 % — ABNORMAL LOW (ref 36.0–46.0)
Hemoglobin: 11 g/dL — ABNORMAL LOW (ref 12.0–15.0)
MCH: 28.1 pg (ref 26.0–34.0)
MCHC: 33.5 g/dL (ref 30.0–36.0)
MCV: 83.7 fL (ref 80.0–100.0)
Platelets: 130 10*3/uL — ABNORMAL LOW (ref 150–400)
RBC: 3.92 MIL/uL (ref 3.87–5.11)
RDW: 14.3 % (ref 11.5–15.5)
WBC: 19.3 10*3/uL — ABNORMAL HIGH (ref 4.0–10.5)
nRBC: 0 % (ref 0.0–0.2)

## 2019-02-06 NOTE — Progress Notes (Signed)
Daily Antepartum Note  Admission Date: 02/04/2019 Current Date: 02/06/2019 7:15 AM  MoldovaSierra Cathell is a 23 y.o. G1P1001, PPD#1 SVD/2nd degree, admitted for severe pre-eclampsia (BP, PC ratio)  Pregnancy complicated by: Patient Active Problem List   Diagnosis Date Noted  . Gestational thrombocytopenia (HCC) 02/06/2019  . NSVD (normal spontaneous vaginal delivery) 02/05/2019  . Second degree perineal laceration during delivery 02/05/2019  . Preeclampsia 02/04/2019  . GBS (group B streptococcus) UTI complicating pregnancy 12/03/2018  . Late prenatal care 12/03/2018  . Supervision of normal first pregnancy 11/24/2018    Overnight/24hr events:  none  Subjective:  No s/s of Mg toxicity  Objective:    Current Vital Signs 24h Vital Sign Ranges  T 97.7 F (36.5 C) Temp  Avg: 97.9 F (36.6 C)  Min: 97.6 F (36.4 C)  Max: 98.3 F (36.8 C)  BP 112/73 BP  Min: 112/73  Max: 152/104  HR 87 Pulse  Avg: 89.2  Min: 69  Max: 155  RR 16 Resp  Avg: 17.5  Min: 16  Max: 20  SaO2 100 % Room Air SpO2  Avg: 99.7 %  Min: 99 %  Max: 100 %       24 Hour I/O Current Shift I/O  Time Ins Outs 06/20 0701 - 06/21 0700 In: 5415.5 [P.O.:1130; I.V.:3885.5] Out: 4150 [Urine:3525] No intake/output data recorded.   Patient Vitals for the past 24 hrs:  BP Temp Temp src Pulse Resp SpO2  02/06/19 0327 112/73 97.7 F (36.5 C) Oral 87 16 100 %  02/05/19 2328 120/82 98.1 F (36.7 C) Oral 83 16 100 %  02/05/19 2052 (!) 141/87 97.7 F (36.5 C) Oral 85 16 99 %  02/05/19 1957 (!) 146/95 98.2 F (36.8 C) Oral 81 16 100 %  02/05/19 1850 (!) 148/108 98.3 F (36.8 C) Oral 88 18 100 %  02/05/19 1745 (!) 147/105 - - 87 - -  02/05/19 1735 (!) 152/104 - - 87 - -  02/05/19 1730 (!) 145/113 - - 89 - -  02/05/19 1717 (!) 140/110 - - 90 - -  02/05/19 1712 (!) 137/97 - - 95 - -  02/05/19 1701 (!) 133/106 - - (!) 121 - 99 %  02/05/19 1534 128/89 97.6 F (36.4 C) Oral 87 - -  02/05/19 1530 (!) 117/104 - - (!) 155 - -   02/05/19 1501 121/71 - - 78 - -  02/05/19 1441 - - - - - 100 %  02/05/19 1430 (!) 134/96 - - 78 - -  02/05/19 1400 133/85 - - 69 18 -  02/05/19 1338 130/86 - - 70 - -  02/05/19 1330 - 97.7 F (36.5 C) Oral - - -  02/05/19 1300 113/78 - - 88 - -  02/05/19 1230 129/89 - - 80 - -  02/05/19 1205 139/89 - - 88 18 -  02/05/19 1200 121/85 - - 87 - -  02/05/19 1155 124/88 - - 89 - -  02/05/19 1150 (!) 133/94 - - 90 - -  02/05/19 1145 (!) 131/94 - - 95 - -  02/05/19 1131 (!) 132/96 - - (!) 104 20 -  02/05/19 1101 112/75 - - 87 18 -  02/05/19 1048 - 97.8 F (36.6 C) Oral - - -  02/05/19 1029 133/85 - - 88 - -  02/05/19 1001 122/76 - - 99 18 -  02/05/19 0943 120/80 - - 89 18 -  02/05/19 0903 - - - - 18 -  02/05/19 0901 121/82 - -  76 - -  02/05/19 0837 131/86 - - 85 - -  02/05/19 0830 (!) 141/96 - - 82 - -  02/05/19 0800 120/75 - - 88 18 -   UOP: >16400mL/hr  Physical exam: General: Well nourished, well developed female in no acute distress. Abdomen: soft, nttp, firm fundus below the umbilicus Cardiovascular: S1, S2 normal, no murmur, rub or gallop, regular rate and rhythm Respiratory: CTAB Skin: Warm and dry.   Medications: Current Facility-Administered Medications  Medication Dose Route Frequency Provider Last Rate Last Dose  . acetaminophen (TYLENOL) tablet 650 mg  650 mg Oral Q4H PRN Leftwich-Kirby, Wilmer FloorLisa A, CNM      . benzocaine-Menthol (DERMOPLAST) 20-0.5 % topical spray 1 application  1 application Topical PRN Leftwich-Kirby, Lisa A, CNM   1 application at 02/05/19 2334  . coconut oil  1 application Topical PRN Leftwich-Kirby, Wilmer FloorLisa A, CNM      . witch hazel-glycerin (TUCKS) pad 1 application  1 application Topical PRN Leftwich-Kirby, Lisa A, CNM       And  . dibucaine (NUPERCAINAL) 1 % rectal ointment 1 application  1 application Rectal PRN Leftwich-Kirby, Wilmer FloorLisa A, CNM      . diphenhydrAMINE (BENADRYL) capsule 25 mg  25 mg Oral Q6H PRN Leftwich-Kirby, Wilmer FloorLisa A, CNM      .  lactated ringers infusion   Intravenous Continuous [place] BingPickens, Raynah Gomes, MD 100 mL/hr at 02/06/19 0540    . magnesium sulfate 40 grams in LR 500 mL OB infusion  2 g/hr Intravenous Continuous [place] BingPickens, Icie Kuznicki, MD 25 mL/hr at 02/05/19 2006 2 g/hr at 02/05/19 2006  . ondansetron (ZOFRAN) tablet 4 mg  4 mg Oral Q4H PRN Leftwich-Kirby, Lisa A, CNM       Or  . ondansetron (ZOFRAN) injection 4 mg  4 mg Intravenous Q4H PRN Leftwich-Kirby, Lisa A, CNM   4 mg at 02/05/19 2342  . oxyCODONE (Oxy IR/ROXICODONE) immediate release tablet 10 mg  10 mg Oral Q4H PRN Leftwich-Kirby, Wilmer FloorLisa A, CNM      . oxyCODONE (Oxy IR/ROXICODONE) immediate release tablet 5 mg  5 mg Oral Q4H PRN Leftwich-Kirby, Lisa A, CNM   5 mg at 02/05/19 2009  . prenatal multivitamin tablet 1 tablet  1 tablet Oral Q1200 Leftwich-Kirby, Lisa A, CNM      . senna-docusate (Senokot-S) tablet 2 tablet  2 tablet Oral QHS PRN [place] BingPickens, Martine Bleecker, MD      . simethicone (MYLICON) chewable tablet 80 mg  80 mg Oral PRN Leftwich-Kirby, Wilmer FloorLisa A, CNM      . Tdap (BOOSTRIX) injection 0.5 mL  0.5 mL Intramuscular Once Leftwich-Kirby, Wilmer FloorLisa A, CNM      . zolpidem (AMBIEN) tablet 5 mg  5 mg Oral QHS PRN [place] PartyLeftwich-Kirby, Lisa A, CNM        Labs:  Recent Labs  Lab 02/05/19 0817 02/05/19 1811 02/06/19 0525  WBC 14.8* 18.7* 19.3*  HGB 12.0 12.3 11.0*  HCT 35.6* 37.1 32.8*  PLT 133* 134* 130*    Recent Labs  Lab 02/04/19 1140 02/05/19 0909 02/06/19 0525  NA 136 135 135  K 4.0 4.0 4.5  CL 105 103 102  CO2 21* 22 24  BUN 8 6 <5*  CREATININE 0.76 0.74 0.82  CALCIUM 8.8* 7.1* 6.4*  PROT 6.8 6.5 5.4*  BILITOT 0.4 0.5 0.5  ALKPHOS 177* 201* 154*  ALT 25 32 29  AST 28 39 38  GLUCOSE 73 107* 89     Radiology: no new imaging  Assessment & Plan:  Pt doing well *PP: routine care. D/w pt more re: BC *Severe pre-x: doing well on no meds. Continue to follow, on Mg until later this afternoon. Will need 1wk bp check *Dispo: likely tomorrow  Durene Romans. MD Attending Center for [facility])

## 2019-02-06 NOTE — Lactation Note (Signed)
This note was copied from a baby's chart. Lactation Consultation Note  Patient Name: Boy Twylia Prinsen Today's Date: 02/06/2019   Infant is now 18 hours old. Mom chose to give a bottle at this last feeding b/c when she pumped for a few minutes nothing came out. I explained that an infant's mouth is often able to express more than a pump. I offered to return later to assess feeding and Mom & MGM agreed.   MGM inquired about pumping frequency. At this time, I stated Mom should pump every time infant receives formula. That recommendation may change after I have observed infant feed.   Mom has sore nipples when feeding (not just at the beginning). Specifics of an asymmetric latch shown via [employer]. Mom & MGM will call me when infant is ready to feed again.   [name] [facility] 02/06/2019, 10:45 AM

## 2019-02-06 NOTE — Lactation Note (Signed)
This note was copied from a baby's chart. Lactation Consultation Note  Patient Name: Briana Carr Today's Date: 02/06/2019   Mom had called out for latch assist, but I was unable to get there in time. Mom reports that she had someone else assist her with latching.  Mom says that breastfeeding feels ok at first, but then it begins to hurt. Mom cannot ascertain that anything is changing during the feeding that permits it to be OK initially, & then to progress into discomfort. Mom reports that when infant releases latch, part of her nipple is flat (similar to a new tube of lipstick, upon my further inquiry).   Mom says she has no questions for me at this time. I will see if an [place] can f/u with Mom this evening.   [name] [facility] 02/06/2019, 2:26 PM

## 2019-02-06 NOTE — Anesthesia Postprocedure Evaluation (Signed)
Anesthesia Post Note  Patient: Briana Carr  Procedure(s) Performed: AN AD [place]     Patient location during evaluation: Mother Baby Anesthesia Type: Epidural Level of consciousness: awake and alert Pain management: pain level controlled Vital Signs Assessment: post-procedure vital signs reviewed and stable Respiratory status: spontaneous breathing, nonlabored ventilation and respiratory function stable Cardiovascular status: stable Postop Assessment: no headache, no backache and epidural receding Anesthetic complications: no    Last Vitals:  Vitals:   02/06/19 0810 02/06/19 1145  BP: 129/79 116/77  Pulse: 78 81  Resp:  20  Temp:  36.7 C  SpO2:  99%    Last Pain:  Vitals:   02/06/19 1145  TempSrc: Oral  PainSc:    Pain Goal: Patients Stated Pain Goal: 2 (02/06/19 0839)                 Rayvon Char

## 2019-02-06 NOTE — Progress Notes (Signed)
CRITICAL VALUE ALERT  Critical Value:  Calcium 6.4  Date & Time Notied:  02-06-19 @ 0612  Provider Notified: Dr. Ilda Basset  Orders Received/Actions taken: no new orders at the time.

## 2019-02-06 NOTE — Lactation Note (Addendum)
This note was copied from a baby's chart. Lactation Consultation Note  Patient Name: Briana Carr QQPYP'P Date: 02/06/2019 Reason for consult: Follow-up assessment;Primapara;1st time breastfeeding;Early term 37-38.6wks  1840 - 1910 - I followed up with Ms. Charity upon [facility] request. She was breast feeding her son [name] upon entry. He was in cradle hold on the left breast and rhythmic suckling sequences were noted.  Mom states that it feels like he's biting. When he released the breast, her nipple was round and in tact. I did note that his latch is a little tight. It may be due to small size of baby (small mouth), but it also appears that he slides down the nipple a bit.  I propped mom up with pillows to keep baby high upon the breast, and showed her how to latch with a U hold and how to do the chin tug.   I reviewed feeding patterns for night 2 and feeding frequency. Mom gave one bottle this am per RN recommendation. Baby is under 6 lbs, and I reviewed supplementation guidelines for 24+ hours.   I recommended that she breast feed first and supplement following. I provided a curved tip syringe and demonstrated how to finger feed in lieu of a bottle nipple, and mom has some slow flow nipples as a back up.  Mom prefers to focus on breast feeding for now. Baby is exhibiting cluster feeding behavior and I reviewed risk factors for weight loss and his small size and discussed why supplementation can be helpful.  Mom has not pumped yet. I changed her flange size to a 27 and reviewed the benefits of breast pumping and volume expectations for days 1-3. If she expresses any milk, I encouraged her to feed it to baby.  We also reviewed hand expression. Colostrum noted, and Ms. Willits can repeat back well.    Maternal Data    Feeding Feeding Type: Breast Fed  LATCH Score                   Interventions    Lactation Tools Discussed/Used Breast pump type: Double-Electric Breast  Pump   Consult Status Consult Status: Follow-up Date: 02/07/19 Follow-up type: In-patient    Lenore Manner 02/06/2019, 7:10 PM

## 2019-02-06 NOTE — Lactation Note (Signed)
This note was copied from a baby's chart. Lactation Consultation Note  Patient Name: Briana Carr Today's Date: 02/06/2019    Lactation visit attempted, but Mom was asleep. LC to f/u later. Infant is 71 hrs old & noted to have lost 3.5% since birth. Infant sleeping in grandmother's arms.   [name] [facility] 02/06/2019, 9:44 AM

## 2019-02-07 MED ORDER — HYDRALAZINE HCL 20 MG/ML IJ SOLN
5.0000 mg | INTRAMUSCULAR | Status: DC | PRN
  Administered 2019-02-07: 07:00:00 5 mg via INTRAVENOUS

## 2019-02-07 MED ORDER — IBUPROFEN 800 MG PO TABS: 800.0000 mg | ORAL_TABLET | Freq: Three times a day (TID) | ORAL | 1 refills | Status: AC | PRN

## 2019-02-07 MED ORDER — LABETALOL HCL 5 MG/ML IV SOLN: 40.0000 mg | INTRAVENOUS | Status: DC | PRN

## 2019-02-07 MED ORDER — HYDRALAZINE HCL 20 MG/ML IJ SOLN: 10.0000 mg | INTRAMUSCULAR | Status: DC | PRN

## 2019-02-07 MED ORDER — MEDROXYPROGESTERONE ACETATE 150 MG/ML IM SUSP: 150.0000 mg | Freq: Once | INTRAMUSCULAR | Status: AC

## 2019-02-07 MED ORDER — HYDRALAZINE HCL 20 MG/ML IJ SOLN
INTRAMUSCULAR | Status: AC
  Filled 2019-02-07: qty 1

## 2019-02-07 MED ORDER — ENALAPRIL MALEATE 10 MG PO TABS
10.0000 mg | ORAL_TABLET | Freq: Every day | ORAL | Status: DC
  Administered 2019-02-07: 10 mg via ORAL
  Filled 2019-02-07: qty 1

## 2019-02-07 MED ORDER — LABETALOL HCL 5 MG/ML IV SOLN: 20.0000 mg | INTRAVENOUS | Status: DC | PRN

## 2019-02-07 MED ORDER — ENALAPRIL MALEATE 10 MG PO TABS: 10.0000 mg | ORAL_TABLET | Freq: Every day | ORAL | 1 refills | Status: AC

## 2019-02-07 NOTE — Lactation Note (Addendum)
This note was copied from a baby's chart. Lactation Consultation Note  Patient Name: Briana Carr Date: 02/07/2019 Reason for consult: Follow-up assessment;Primapara;1st time breastfeeding;Early term 37-38.6wks;Infant < 6lbs;Infant weight loss  85 hours old ETI who is being partially BF and formula fed by his mother, she's a P1. Mom and baby are going home today, he's at 4.3% weight loss. Reviewed discharge instructions, engorgement prevention and treatment, treatment and prevention for sore nipples and red flags on when to call your baby's pediatrician.   Infant already nursing when entering the room, but noticed that latch was slightly shallow, mom had baby swaddled. Suggested mom to try STS on posterior feedings. LC also saw a Medela lanolin and Medela NS in her room, and advised her to d/c lanolin and use coconut oil instead if she wanted to use something in addition to her colostrum for breast care. Mom told LC that baby didn't like the NS; she was nursing baby without it. Baby still nursing when exiting the room at the 16 minutes mark.  She already has a pediatrician appt schedule for baby and understands the need of supplementation (whether with her own EBM or Similac 22 calorie formula) after feedings. She's aware that feeding plan will be revised once she takes baby to his next pediatrician appt. Mom reported all questions and concerns were answered, they're both aware of [facility] services and will contact PRN.  Maternal Data    Feeding Feeding Type: Breast Fed  LATCH Score Latch: Grasps breast easily, tongue down, lips flanged, rhythmical sucking.  Audible Swallowing: A few with stimulation  Type of Nipple: Everted at rest and after stimulation  Comfort (Breast/Nipple): Soft / non-tender  Hold (Positioning): Assistance needed to correctly position infant at breast and maintain latch.  LATCH Score: 8  Interventions Interventions: Breast feeding basics  reviewed  Lactation Tools Discussed/Used     Consult Status Consult Status: Complete Date: 02/07/19 Follow-up type: Call as needed    Armstrong Creasy Francene Boyers 02/07/2019, 12:31 PM

## 2019-02-07 NOTE — Discharge Instructions (Signed)
Hypertension During Pregnancy  Hypertension is also called high blood pressure. High blood pressure means that the force of your blood moving in your body is too strong. When you are pregnant, this condition should be watched carefully. It can cause problems for you and your baby. Follow these instructions at home: Eating and drinking   Drink enough fluid to keep your pee (urine) pale yellow.  Avoid caffeine. Lifestyle  Do not use any products that contain nicotine or tobacco, such as cigarettes and e-cigarettes. If you need help quitting, ask your doctor.  Do not use alcohol or drugs.  Avoid stress.  Rest and get plenty of sleep. General instructions  Take over-the-counter and prescription medicines only as told by your doctor.  While lying down, lie on your left side. This keeps pressure off your major blood vessels.  While sitting or lying down, raise (elevate) your feet. Try putting some pillows under your lower legs.  Exercise regularly. Ask your doctor what kinds of exercise are best for you.  Keep all prenatal and follow-up visits as told by your doctor. This is important. Contact a doctor if:  You have symptoms that your doctor told you to watch for, such as: ? Throwing up (vomiting). ? Feeling sick to your stomach (nausea). ? Headache. Get help right away if you have:  Very bad belly pain that does not get better with treatment.  A very bad headache that does not get better.  Throwing up that does not get better with treatment.  Sudden, fast weight gain.  Sudden swelling in your hands, ankles, or face.  Bleeding from your vagina.  Blood in your pee.  Fewer movements from your baby than usual.  Blurry vision.  Double vision.  Muscle twitching.  Sudden muscle tightening (spasms).  Trouble breathing.  Blue fingernails or lips. Summary  Hypertension is also called high blood pressure. High blood pressure means that the force of your blood moving  in your body is too strong.  When you are pregnant, this condition should be watched carefully. It can cause problems for you and your baby.  Get help right away if you have symptoms that your doctor told you to watch for. This information is not intended to replace advice given to you by your health care provider. Make sure you discuss any questions you have with your health care provider. Document Released: 09/06/2010 Document Revised: 07/21/2017 Document Reviewed: 04/15/2016 Elsevier Interactive Patient Education  2019 [place]. Vaginal Delivery, Care After Refer to this sheet in the next few weeks. These instructions provide you with information about caring for yourself after vaginal delivery. Your health care provider may also give you more specific instructions. Your treatment has been planned according to current medical practices, but problems sometimes occur. Call your health care provider if you have any problems or questions. What can I expect after the procedure? After vaginal delivery, it is common to have:  Some bleeding from your vagina.  Soreness in your abdomen, your vagina, and the area of skin between your vaginal opening and your anus (perineum).  Pelvic cramps.  Fatigue. Follow these instructions at home: Medicines  Take over-the-counter and prescription medicines only as told by your health care provider.  If you were prescribed an antibiotic medicine, take it as told by your health care provider. Do not stop taking the antibiotic until it is finished. Driving   Do not drive or operate heavy machinery while taking prescription pain medicine.  Do not drive for  24 hours if you received a sedative. Lifestyle  Do not drink alcohol. This is especially important if you are breastfeeding or taking medicine to relieve pain.  Do not use tobacco products, including cigarettes, chewing tobacco, or e-cigarettes. If you need help quitting, ask your health care  provider. Eating and drinking  Drink at least 8 eight-ounce glasses of water every day unless you are told not to by your health care provider. If you choose to breastfeed your baby, you may need to drink more water than this.  Eat high-fiber foods every day. These foods may help prevent or relieve constipation. High-fiber foods include: ? Whole grain cereals and breads. ? Brown rice. ? Beans. ? Fresh fruits and vegetables. Activity  Return to your normal activities as told by your health care provider. Ask your health care provider what activities are safe for you.  Rest as much as possible. Try to rest or take a nap when your baby is sleeping.  Do not lift anything that is heavier than your baby or 10 lb (4.5 kg) until your health care provider says that it is safe.  Talk with your health care provider about when you can engage in sexual activity. This may depend on your: ? Risk of infection. ? Rate of healing. ? Comfort and desire to engage in sexual activity. Vaginal Care  If you have an episiotomy or a vaginal tear, check the area every day for signs of infection. Check for: ? More redness, swelling, or pain. ? More fluid or blood. ? Warmth. ? Pus or a bad smell.  Do not use tampons or douches until your health care provider says this is safe.  Watch for any blood clots that may pass from your vagina. These may look like clumps of dark red, brown, or black discharge. General instructions  Keep your perineum clean and dry as told by your health care provider.  Wear loose, comfortable clothing.  Wipe from front to back when you use the toilet.  Ask your health care provider if you can shower or take a bath. If you had an episiotomy or a perineal tear during labor and delivery, your health care provider may tell you not to take baths for a certain length of time.  Wear a bra that supports your breasts and fits you well.  If possible, have someone help you with household  activities and help care for your baby for at least a few days after you leave the hospital.  Keep all follow-up visits for you and your baby as told by your health care provider. This is important. Contact a health care provider if:  You have: ? Vaginal discharge that has a bad smell. ? Difficulty urinating. ? Pain when urinating. ? A sudden increase or decrease in the frequency of your bowel movements. ? More redness, swelling, or pain around your episiotomy or vaginal tear. ? More fluid or blood coming from your episiotomy or vaginal tear. ? Pus or a bad smell coming from your episiotomy or vaginal tear. ? A fever. ? A rash. ? Little or no interest in activities you used to enjoy. ? Questions about caring for yourself or your baby.  Your episiotomy or vaginal tear feels warm to the touch.  Your episiotomy or vaginal tear is separating or does not appear to be healing.  Your breasts are painful, hard, or turn red.  You feel unusually sad or worried.  You feel nauseous or you vomit.  You pass  large blood clots from your vagina. If you pass a blood clot from your vagina, save it to show to your health care provider. Do not flush blood clots down the toilet without having your health care provider look at them.  You urinate more than usual.  You are dizzy or light-headed.  You have not breastfed at all and you have not had a menstrual period for 12 weeks after delivery.  You have stopped breastfeeding and you have not had a menstrual period for 12 weeks after you stopped breastfeeding. Get help right away if:  You have: ? Pain that does not go away or does not get better with medicine. ? Chest pain. ? Difficulty breathing. ? Blurred vision or spots in your vision. ? Thoughts about hurting yourself or your baby.  You develop pain in your abdomen or in one of your legs.  You develop a severe headache.  You faint.  You bleed from your vagina so much that you fill two  sanitary pads in one hour. This information is not intended to replace advice given to you by your health care provider. Make sure you discuss any questions you have with your health care provider. Document Released: 08/01/2000 Document Revised: 01/16/2016 Document Reviewed: 08/19/2015 Elsevier Interactive Patient Education  2019 [employer]

## 2019-02-07 NOTE — Progress Notes (Signed)
This AM the patient had severe range BP  177/101 @ 0615 164/87 @ 4469  Dr Harolyn Rutherford was notified at [place]. No new orders at this time. MD called back at [phone] to notify RN that she just placed the  Hydralazine protocol. Will start protocol and continue to monitor.

## 2019-02-14 ENCOUNTER — Encounter: Payer: Medicaid Other | Admitting: Obstetrics & Gynecology

## 2019-02-15 ENCOUNTER — Other Ambulatory Visit: Payer: Self-pay

## 2019-02-15 ENCOUNTER — Ambulatory Visit: Payer: Medicaid Other

## 2019-02-15 VITALS — BP 135/87 | HR 96

## 2019-02-15 DIAGNOSIS — Z0130 Encounter for examination of blood pressure without abnormal findings: Secondary | ICD-10-CM

## 2019-02-15 NOTE — Progress Notes (Signed)
Subjective:  Briana Carr is a 23 y.o. female here for BP check.   Hypertension ROS: taking medications as instructed, no medication side effects noted, no TIA's, no chest pain on exertion, no dyspnea on exertion and no swelling of ankles.    Objective:  LMP 05/16/2018   Appearance alert, well appearing, and in no distress. General exam BP noted to be well controlled today in office.    Assessment:   Blood Pressure stable.   Plan:  Current treatment plan is effective, no change in therapy.Marland Kitchen

## 2019-02-17 NOTE — Progress Notes (Signed)
Patient ID: Briana Carr, female   DOB: [birth year], 23 y.o.   MRN: [id number] I have reviewed the chart and agree with nursing staff's documentation of this patient's encounter.  Emeterio Reeve, MD 02/17/2019 10:15 AM

## 2019-03-10 ENCOUNTER — Encounter: Payer: Self-pay | Admitting: Certified Nurse Midwife

## 2019-03-10 ENCOUNTER — Other Ambulatory Visit: Payer: Self-pay

## 2019-03-10 ENCOUNTER — Ambulatory Visit (INDEPENDENT_AMBULATORY_CARE_PROVIDER_SITE_OTHER): Payer: Medicaid Other | Admitting: Certified Nurse Midwife

## 2019-03-10 DIAGNOSIS — Z3009 Encounter for other general counseling and advice on contraception: Secondary | ICD-10-CM

## 2019-03-10 DIAGNOSIS — Z3202 Encounter for pregnancy test, result negative: Secondary | ICD-10-CM | POA: Diagnosis not present

## 2019-03-10 LAB — POCT URINE PREGNANCY: Preg Test, Ur: NEGATIVE

## 2019-03-10 NOTE — Progress Notes (Signed)
Post Partum Exam  Briana Carr is a 23 y.o. [id number] female who presents for a postpartum visit. She is 4 weeks postpartum following a spontaneous vaginal delivery. I have fully reviewed the prenatal and intrapartum course. The delivery was at 85 gestational weeks.  Anesthesia: epidural. Postpartum course has been well. Baby's course has been well. Baby is feeding by breast. Bleeding no bleeding. Bowel function is normal. Bladder function is normal. Patient is not sexually active. Contraception method is none. Postpartum depression screening:neg  The following portions of the patient's history were reviewed and updated as appropriate: allergies, current medications and problem list. Last pap smear done 11/2018 and was Abnormal- ASCUS with no HR HPV, needs repeat in 3 years  Review of Systems A comprehensive review of systems was negative.    Objective:  Last menstrual period 05/16/2018, unknown if currently breastfeeding.  General:  alert, cooperative and no distress   Breasts:  inspection negative, no nipple discharge or bleeding, no masses or nodularity palpable  Lungs: clear to auscultation bilaterally  Heart:  regular rate and rhythm  Abdomen: soft, non-tender; bowel sounds normal; no masses,  no organomegaly   Vulva:  not evaluated  Vagina: not evaluated  Cervix:  not evaluated  Corpus: not examined  Adnexa:  not evaluated  Rectal Exam: Not performed.        Assessment/Plan:  1. Birth control counseling - Patient initially wanted OCP on arrival to appointment, wants them to take away cycles or have shorter cycles  - Educated and discussed birth control options in detail including LARCs, discussed r/b and side effect of each option  - After discussed patient is thinking about the IUD but does not want it done today, detailed information packet given to patient  - Encouraged her not to have IC for the next two week then return for placement of IUD and pelvic examination.  - POCT urine  pregnancy  2. Postpartum care and examination - Normal postpartum exam. Pap smear not done at today's visit.  - Will complete pelvic examination at IUD insertion appointment   Contraception: IUD Follow up in: 2 weeks for IUD insertion.   Lajean Manes, CNM 03/10/19, 1:48 PM

## 2019-03-24 ENCOUNTER — Ambulatory Visit: Payer: Medicaid Other | Admitting: Certified Nurse Midwife

## 2019-03-25 ENCOUNTER — Telehealth: Payer: Self-pay | Admitting: Certified Nurse Midwife

## 2020-02-28 DIAGNOSIS — F4310 Post-traumatic stress disorder, unspecified: Secondary | ICD-10-CM | POA: Diagnosis not present

## 2020-03-06 DIAGNOSIS — F4310 Post-traumatic stress disorder, unspecified: Secondary | ICD-10-CM | POA: Diagnosis not present

## 2020-03-13 DIAGNOSIS — F4310 Post-traumatic stress disorder, unspecified: Secondary | ICD-10-CM | POA: Diagnosis not present

## 2020-03-20 DIAGNOSIS — F4310 Post-traumatic stress disorder, unspecified: Secondary | ICD-10-CM | POA: Diagnosis not present

## 2020-03-27 DIAGNOSIS — F4310 Post-traumatic stress disorder, unspecified: Secondary | ICD-10-CM | POA: Diagnosis not present

## 2020-04-03 DIAGNOSIS — F4310 Post-traumatic stress disorder, unspecified: Secondary | ICD-10-CM | POA: Diagnosis not present

## 2020-04-11 DIAGNOSIS — F4310 Post-traumatic stress disorder, unspecified: Secondary | ICD-10-CM | POA: Diagnosis not present

## 2020-04-17 DIAGNOSIS — F4310 Post-traumatic stress disorder, unspecified: Secondary | ICD-10-CM | POA: Diagnosis not present

## 2020-04-28 IMAGING — US US MFM OB FOLLOW UP
1 series · 14 of 28 positions shown · non-contrast
Comparison: none

[Series 1: us mfm ob follow up · 106 acquisitions, 14 frames shown]
[im 4/106]
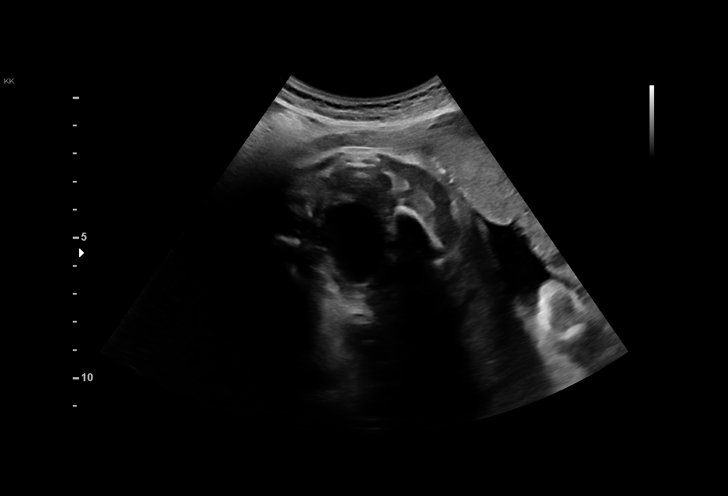
[im 12/106]
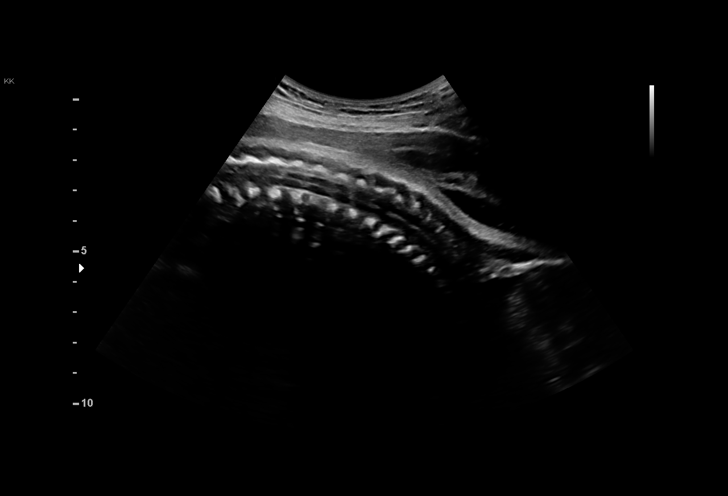
[im 20/106]
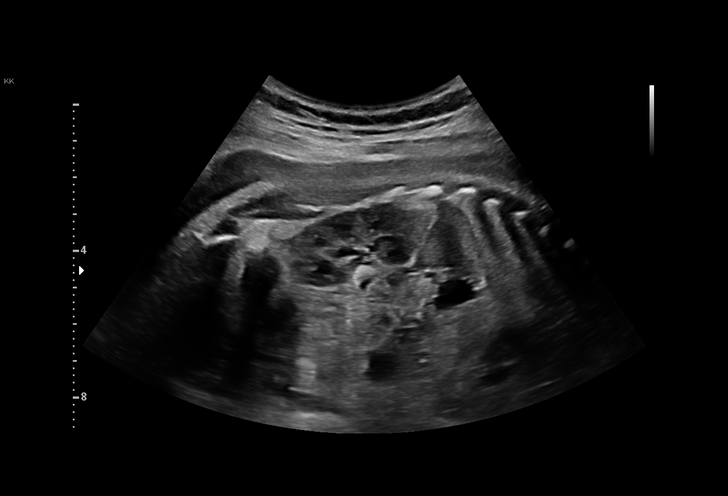
[im 28/106]
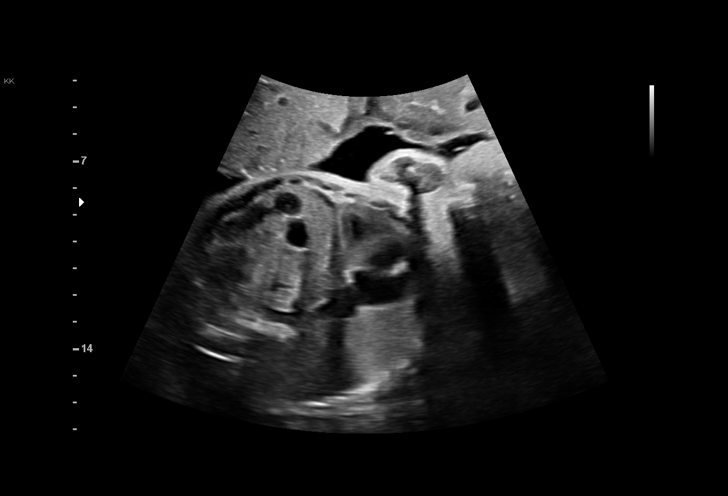
[im 36/106]
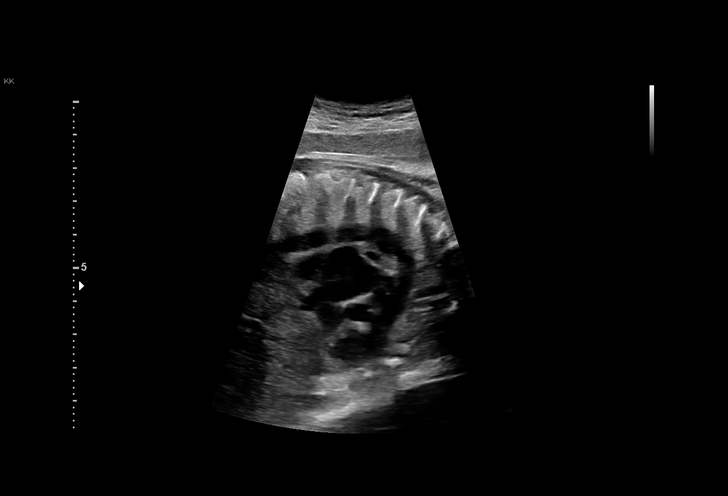
[im 43/106]
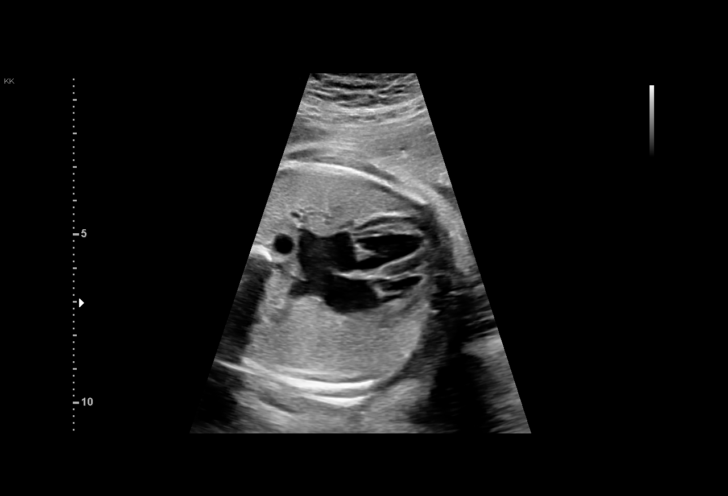
[im 51/106]
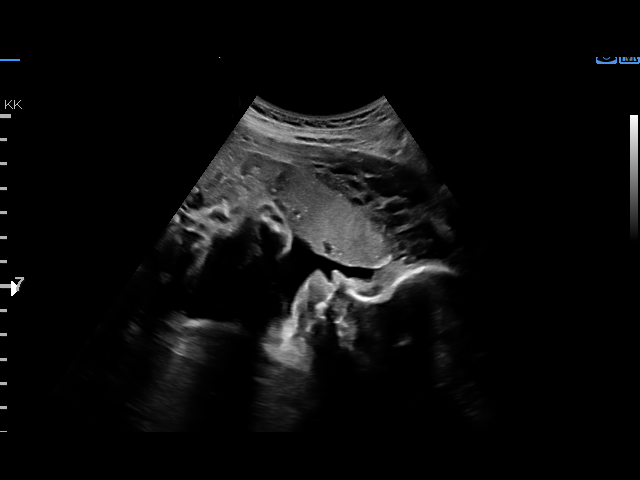
[im 59/106]
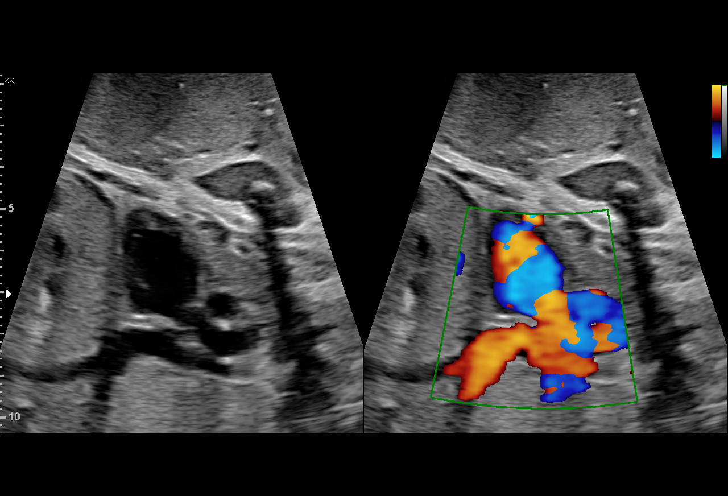
[im 67/106]
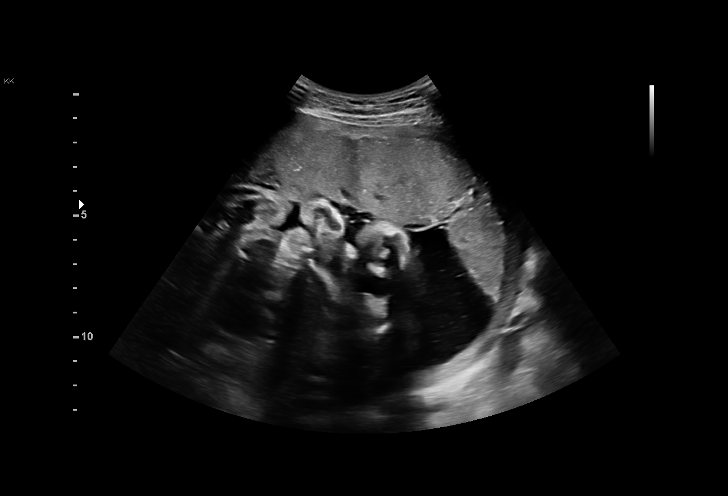
[im 74/106]
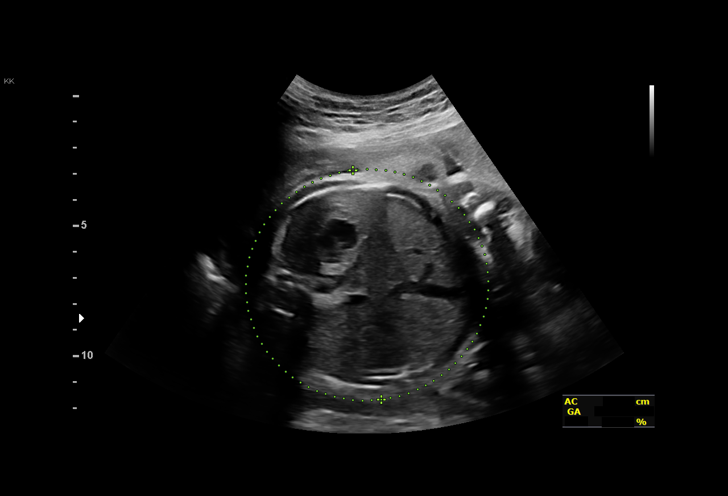
[im 82/106]
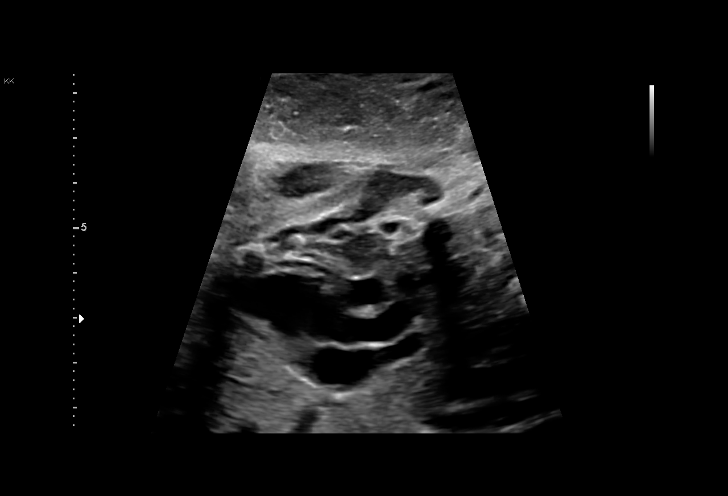
[im 90/106]
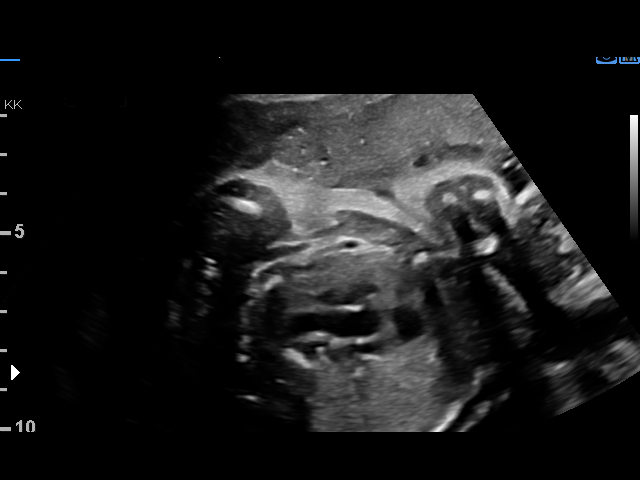
[im 98/106]
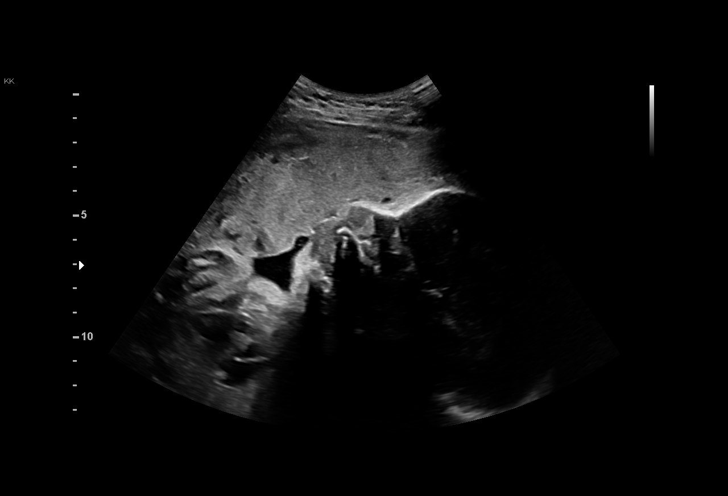
[im 106/106]
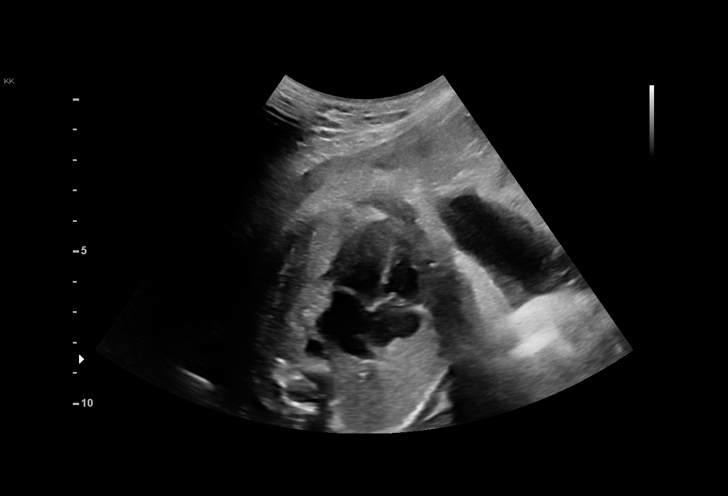

[14 of 28 positions shown; findings below may reference images not displayed]

----------------------------------------------------------------------

 ----------------------------------------------------------------------
Indications

  33 weeks gestation of pregnancy
  Late to prenatal care, third trimester (Low
  Risk NIPS)
  Antenatal follow-up for nonvisualized fetal
  anatomy
 ----------------------------------------------------------------------
Vital Signs

 BMI:
Fetal Evaluation

 Num Of Fetuses:         1
 Fetal Heart Rate(bpm):  127
 Cardiac Activity:       Observed
 Presentation:           Cephalic
 Placenta:               Anterior
 P. Cord Insertion:      Previously Visualized

 Amniotic Fluid
 AFI FV:      Within normal limits

 AFI Sum(cm)     %Tile       Largest Pocket(cm)
 10.11           19

 RUQ(cm)       RLQ(cm)       LUQ(cm)        LLQ(cm)

Biometry
 BPD:      81.6  mm     G. Age:  32w 6d         21  %    CI:        76.35   %    70 - 86
                                                         FL/HC:      20.6   %    19.4 -
 HC:      295.9  mm     G. Age:  32w 5d          5  %    HC/AC:      1.04        0.96 -
 AC:      285.7  mm     G. Age:  32w 4d         22  %    FL/BPD:     74.9   %    71 - 87
 FL:       61.1  mm     G. Age:  31w 5d          5  %    FL/AC:      21.4   %    20 - 24

 Est. FW:    4204  gm      4 lb 5 oz     33  %
OB History

 Gravidity:    1
Gestational Age

 LMP:           33w 5d        Date:  05/16/18                 EDD:   02/20/19
 U/S Today:     32w 3d                                        EDD:   03/01/19
 Best:          33w 5d     Det. By:  LMP  (05/16/18)          EDD:   02/20/19
Anatomy

 Cranium:               Appears normal         Aortic Arch:            Appears normal
 Cavum:                 Previously seen        Ductal Arch:            Appears normal
 Ventricles:            Previously seen        Diaphragm:              Appears normal
 Choroid Plexus:        Previously seen        Stomach:                Appears normal, left
                                                                       sided
 Cerebellum:            Previously seen        Abdomen:                Appears normal
 Posterior Fossa:       Previously seen        Abdominal Wall:         Not well visualized
 Nuchal Fold:           Not applicable (>20    Cord Vessels:           Previously seen
                        wks GA)
 Face:                  Appears normal         Kidneys:                Appear normal
                        (orbits and profile)
 Lips:                  Appears normal         Bladder:                Appears normal
 Thoracic:              Appears normal         Spine:                  Limited views
                                                                       appear normal
 Heart:                 Appears normal         Upper Extremities:      Appears normal
                        (4CH, axis, and
                        situs)
 RVOT:                  Appears normal         Lower Extremities:      Appears normal
 LVOT:                  Appears normal

 Other:  Parents do not wish to know sex of fetus. Technically difficult due to
         advanced GA and fetal position.
Cervix Uterus Adnexa

 Cervix
 Not visualized (advanced GA >45wks)
Impression

 Fetal growth is appropriate for gestational age. Amniotic fluid
 is normal and good fetal activity is seen. Good interval growth
 is seen.
Recommendations

 Follow-up scans as clinically indicated.
                 [place], [place]

## 2020-05-03 DIAGNOSIS — F4310 Post-traumatic stress disorder, unspecified: Secondary | ICD-10-CM | POA: Diagnosis not present

## 2020-05-10 DIAGNOSIS — F4310 Post-traumatic stress disorder, unspecified: Secondary | ICD-10-CM | POA: Diagnosis not present

## 2020-05-16 DIAGNOSIS — F4310 Post-traumatic stress disorder, unspecified: Secondary | ICD-10-CM | POA: Diagnosis not present

## 2020-05-22 DIAGNOSIS — F4310 Post-traumatic stress disorder, unspecified: Secondary | ICD-10-CM | POA: Diagnosis not present

## 2020-05-30 DIAGNOSIS — F4310 Post-traumatic stress disorder, unspecified: Secondary | ICD-10-CM | POA: Diagnosis not present

## 2020-06-07 DIAGNOSIS — F4310 Post-traumatic stress disorder, unspecified: Secondary | ICD-10-CM | POA: Diagnosis not present

## 2020-06-12 DIAGNOSIS — F4310 Post-traumatic stress disorder, unspecified: Secondary | ICD-10-CM | POA: Diagnosis not present

## 2020-06-21 DIAGNOSIS — F4310 Post-traumatic stress disorder, unspecified: Secondary | ICD-10-CM | POA: Diagnosis not present

## 2020-06-26 DIAGNOSIS — F4310 Post-traumatic stress disorder, unspecified: Secondary | ICD-10-CM | POA: Diagnosis not present

## 2020-07-18 DIAGNOSIS — F4310 Post-traumatic stress disorder, unspecified: Secondary | ICD-10-CM | POA: Diagnosis not present

## 2020-07-25 DIAGNOSIS — F4310 Post-traumatic stress disorder, unspecified: Secondary | ICD-10-CM | POA: Diagnosis not present

## 2020-08-16 DIAGNOSIS — F4310 Post-traumatic stress disorder, unspecified: Secondary | ICD-10-CM | POA: Diagnosis not present

## 2020-08-23 DIAGNOSIS — F4310 Post-traumatic stress disorder, unspecified: Secondary | ICD-10-CM | POA: Diagnosis not present

## 2020-08-28 DIAGNOSIS — F4310 Post-traumatic stress disorder, unspecified: Secondary | ICD-10-CM | POA: Diagnosis not present

## 2020-09-06 DIAGNOSIS — F4310 Post-traumatic stress disorder, unspecified: Secondary | ICD-10-CM | POA: Diagnosis not present

## 2020-10-16 DIAGNOSIS — F4310 Post-traumatic stress disorder, unspecified: Secondary | ICD-10-CM | POA: Diagnosis not present

## 2024-02-03 DIAGNOSIS — E860 Dehydration: Secondary | ICD-10-CM | POA: Diagnosis not present

## 2024-02-03 DIAGNOSIS — E876 Hypokalemia: Secondary | ICD-10-CM | POA: Diagnosis not present

## 2024-02-03 DIAGNOSIS — R112 Nausea with vomiting, unspecified: Secondary | ICD-10-CM | POA: Diagnosis not present

## 2024-02-03 DIAGNOSIS — R1084 Generalized abdominal pain: Secondary | ICD-10-CM | POA: Diagnosis not present
